# Patient Record
Sex: Female | Born: 1980
Health system: Southern US, Community
[De-identification: ages and names within clinical notes are randomized; demographics above are authoritative.]

## PROBLEM LIST (undated history)

## (undated) DIAGNOSIS — N920 Excessive and frequent menstruation with regular cycle: Secondary | ICD-10-CM

## (undated) DIAGNOSIS — Z973 Presence of spectacles and contact lenses: Secondary | ICD-10-CM

## (undated) DIAGNOSIS — Z923 Personal history of irradiation: Secondary | ICD-10-CM

## (undated) DIAGNOSIS — D649 Anemia, unspecified: Secondary | ICD-10-CM

## (undated) DIAGNOSIS — I2699 Other pulmonary embolism without acute cor pulmonale: Secondary | ICD-10-CM

## (undated) DIAGNOSIS — Z8742 Personal history of other diseases of the female genital tract: Secondary | ICD-10-CM

## (undated) DIAGNOSIS — D509 Iron deficiency anemia, unspecified: Secondary | ICD-10-CM

## (undated) HISTORY — DX: Other pulmonary embolism without acute cor pulmonale: I26.99

## (undated) HISTORY — PX: COLPOSCOPY: SHX161

---

## 2005-01-29 ENCOUNTER — Emergency Department (HOSPITAL_COMMUNITY): Admission: EM | Admit: 2005-01-29 | Discharge: 2005-01-29 | Payer: Self-pay | Admitting: Emergency Medicine

## 2007-11-08 ENCOUNTER — Inpatient Hospital Stay (HOSPITAL_COMMUNITY): Admission: AD | Admit: 2007-11-08 | Discharge: 2007-11-08 | Payer: Self-pay | Admitting: *Deleted

## 2007-11-09 ENCOUNTER — Inpatient Hospital Stay (HOSPITAL_COMMUNITY): Admission: AD | Admit: 2007-11-09 | Discharge: 2007-11-10 | Payer: Self-pay | Admitting: Obstetrics and Gynecology

## 2007-11-10 ENCOUNTER — Inpatient Hospital Stay (HOSPITAL_COMMUNITY): Admission: AD | Admit: 2007-11-10 | Discharge: 2007-11-10 | Payer: Self-pay | Admitting: Obstetrics and Gynecology

## 2007-11-13 ENCOUNTER — Encounter (INDEPENDENT_AMBULATORY_CARE_PROVIDER_SITE_OTHER): Payer: Self-pay | Admitting: *Deleted

## 2007-11-13 ENCOUNTER — Inpatient Hospital Stay (HOSPITAL_COMMUNITY): Admission: AD | Admit: 2007-11-13 | Discharge: 2007-11-16 | Payer: Self-pay | Admitting: Obstetrics and Gynecology

## 2009-03-22 ENCOUNTER — Other Ambulatory Visit: Admission: RE | Admit: 2009-03-22 | Discharge: 2009-03-22 | Payer: Self-pay | Admitting: *Deleted

## 2010-11-07 NOTE — Op Note (Signed)
NAMEJOCELYNNE, Tanya Rangel               ACCOUNT NO.:  [id number]   MEDICAL RECORD NO.:  [id number]          PATIENT TYPE:  INP   LOCATION:  9107                          FACILITY:  [facility]   PHYSICIAN:  Gerri Spore B. Earlene Plater, M.D.  DATE OF BIRTH:  Dec 24, 1980   DATE OF PROCEDURE:  11/13/2007  DATE OF DISCHARGE:                               OPERATIVE REPORT   PREOPERATIVE DIAGNOSIS:  1. A 38-week intrauterine pregnancy.  2. Chorioamnionitis.  3. Fetal tachycardia.   POSTOPERATIVE DIAGNOSIS:  1. A 38-week intrauterine pregnancy.  2. Chorioamnionitis.  3. Fetal tachycardia.   PROCEDURE:  Primary low-transverse C-section.   SURGEON:  Chester Holstein. Earlene Plater, MD.   ASSISTANT:  None.   ANESTHESIA:  Epidural.   FINDINGS:  Viable female 6 pounds 2 ounces, Apgars 9 and 10, pH 7.31,  normal-appearing uterus, tubes, and ovaries.  Placenta was specimen to  pathology.   BLOOD LOSS:  600.   COMPLICATIONS:  None.   INDICATIONS:  The patient presents with spontaneous labor with  spontaneous rupture of membranes, slow progress with maternal axillary  temperature of 103 despite Tylenol and two doses of Unasyn.  Cervix was  only 4 cm dilated in its first pregnancy.  Consideration was made from  delivery was such high fever and associated fetal tachycardia  recommended cesarean section.  Risks of surgery was discussed including  infection, bleeding, and damage to surrounding organs.   PROCEDURE:  The patient was taken to the operating room and epidural  anesthesia deemed adequate.  A Pfannenstiel incision was made.  Fascia  was divided by sharply.  Underlying rectus muscle was dissected off  sharply.  Posterior sheath and peritoneum were entered sharply.  Bladder  blade was inserted.  Bladder flap was created sharply.  Uterine incision  made in low transverse fashion with a knife.  Clear fluid amniotomy  extended laterally bluntly.  Vertex elevated to the incision assistance  with the Mushroom cup vacuum on  the flexion point one pull and no pop  offs from the green zone to deliver the vertex.  Nose and mouth  suctioned with the bulb.  Remainder  of the infant delivered without  great difficulty.  Cord clamped and cut.  The infant was handed off to  awaiting pediatricians.  The patient had just received 1 gram of Unasyn  prior to incision.   Placenta was removed by uterine massage.  Uterus was cleared of all  clots and debris.  Incision was free of extension.  Closed in two-layer  with 0 chromic, first in a running locked and then imbricating fashion.  Hemostasis was obtained.   Pelvis was irrigated.  Bladder flap, uterine incision, and subfascial  space were all hemostatic.  Fascia was closed in a running stitch of 0  Vicryl.  Subcutaneous tissue was irrigated and hemostatic with Bovie.  Skin was closed with staples.   The patient tolerated the procedure well with no complications.  She was  taken to the recovery room in awake, alert, and stable condition.  All  counts correct per the operating staff.  Gerri Spore B. Earlene Plater, M.D.  Electronically Signed     WBD/MEDQ  D:  11/13/2007  T:  11/14/2007  Job:  [id number]

## 2010-11-07 NOTE — H&P (Signed)
NAMESACHIKO, Tanya Rangel               ACCOUNT NO.:  [id number]   MEDICAL RECORD NO.:  [id number]          PATIENT TYPE:  INP   LOCATION:  9107                          FACILITY:  [facility]   PHYSICIAN:  Lenoard Aden, M.D.DATE OF BIRTH:  04/25/1981   DATE OF ADMISSION:  11/13/2007  DATE OF DISCHARGE:                              HISTORY & PHYSICAL   CHIEF COMPLAINT:  Labor.   She is a 30 year old African American female who presents at 75 weeks'  gestation with irregular mild contractions and a fever of questionable  etiology upon presentation.  Fetal tachycardia was noted.  White blood  cell count of 12,000 with a left shift was noted.  Fetal tachycardia  with good beat-to-beat variability was noted.  Due to questionable  leakage of fluid, an ultrasound was performed revealing a biophysical  profile of 8/8, normal AFI, vertex presenting.  However, upon return  from ultrasound, the patient had spontaneous rupture of membranes at  approximately 2:00 a.m. at which point she was admitted, started on  Pitocin, given an epidural, and started empirically on IV antibiotics.   ALLERGIES:  She has no known drug allergies.   MEDICATIONS:  Prenatal vitamins.   Her prenatal course has been complicated by multiple visits for  prodromal labor without cervical change noted.  She has a social history  which is noncontributory and a family history which is noncontributory.  This is her first pregnancy.   PHYSICAL EXAMINATION:  GENERAL:  She is a well-developed, well-nourished  [place] female in no acute distress.  HEENT:  Normal.  LUNGS:  Clear.  HEART:  Regular rate and rhythm.  ABDOMEN:  Soft, gravid, and nontender at this time due to epidural.  No  CVA tenderness.  EXTREMITIES:  There are no cords.  NEUROLOGIC:  Nonfocal.  SKIN:  Intact.  PELVIC:  Cervix is 2 cm, 98, vertex, -1.  Fetal heart rate tracing in  the 150 to 170 beats per minute, change with accelerations.  No  recurrent  decelerations.  Good beat-to-beat variability.  Irregular  contractions.   IMPRESSION:  1. Term intrauterine pregnancy in early labor.  2. Fever of questionable etiology.  We will treat empirically.   PLAN:  Unasyn 2 g IV q. 6 h..  IUPC, Pitocin, epidural, anticipate  attempts at vaginal delivery.      Lenoard Aden, M.D.  Electronically Signed     RJT/MEDQ  D:  11/13/2007  T:  11/13/2007  Job:  [id number]

## 2010-11-10 NOTE — Discharge Summary (Signed)
Tanya Rangel, Tanya Rangel               ACCOUNT NO.:  [id number]   MEDICAL RECORD NO.:  [id number]          PATIENT TYPE:  INP   LOCATION:  9107                          FACILITY:  [facility]   PHYSICIAN:  Gerri Spore B. Earlene Plater, M.D.  DATE OF BIRTH:  [birth year]   DATE OF ADMISSION:  11/13/2007  DATE OF DISCHARGE:  11/16/2007                               DISCHARGE SUMMARY   ADMISSION DIAGNOSIS:  1. A 38-week intrauterine pregnancy.  2. Chorioamnionitis.  3. Fetal tachycardia.   DISCHARGE DIAGNOSES:  1. A 38-week intrauterine pregnancy.  2. Chorioamnionitis.  3. Fetal tachycardia.   PROCEDURE:  Primary low transverse C-section.   HISTORY OF PRESENT ILLNESS:  For details, please see the H&P on the  chart.  Briefly, the patient presented spontaneous labor and progressed  to approximately 4-cm dilated when she was noted to have fever of 102.8  axillary with fetal heart rate in the 160-180s with intermittent  variable decelerations and decreased variability.  She was placed on  Unasyn earlier in the day 4, although her temperature elevations given  the severity of her fever and remoteness from delivery and associated  fetal heart rate changes.  I recommended we proceed with C-section.  As  it seemed, the patient was quite sometime to go prior to being able to  deliver vaginally and did not feel they would tolerate a labor.   The patient was subsequently delivered by primary C-section, viable  female, Apgars 9 and 10, pH 7.31, weight 6 pounds 2 ounces, normal-  appearing uterus, tubes, and ovaries.   Postoperatively, the patient was placed on ampicillin, gentamicin, and  clindamycin for approximately 24 hours postop after which time, she was  afebrile.  By the third postoperative day, the patient was ambulating,  voiding, and tolerating regular diet.  She was discharged home in  satisfactory condition.   DISCHARGE INSTRUCTIONS:  Per booklet.   DISCHARGE MEDICATIONS:  Tylox 1-2 p.o. q.4 h. p.r.n.  pain.   DISPOSITION AT DISCHARGE:  Satisfactory.   FOLLOWUP:  6 weeks.      Gerri Spore B. Earlene Plater, M.D.  Electronically Signed     WBD/MEDQ  D:  12/19/2007  T:  12/20/2007  Job:  [id number]

## 2011-03-21 LAB — DIFFERENTIAL
Basophils Absolute: 0
Lymphocytes Relative: 7 — ABNORMAL LOW
Lymphs Abs: 0.9
Monocytes Absolute: 0.8
Neutro Abs: 10.3 — ABNORMAL HIGH

## 2011-03-21 LAB — CBC
HCT: 29.8 — ABNORMAL LOW
Hemoglobin: 10.2 — ABNORMAL LOW
Hemoglobin: 11.9 — ABNORMAL LOW
Platelets: 267
RBC: 3.53 — ABNORMAL LOW
RDW: 13.8
WBC: 12 — ABNORMAL HIGH

## 2011-03-21 LAB — RPR: RPR Ser Ql: NONREACTIVE

## 2011-03-21 LAB — CREATININE, SERUM
Creatinine, Ser: 0.73
GFR calc non Af Amer: >60

## 2012-04-03 ENCOUNTER — Other Ambulatory Visit: Payer: Self-pay | Admitting: Family Medicine

## 2012-04-03 ENCOUNTER — Other Ambulatory Visit (HOSPITAL_COMMUNITY)
Admission: RE | Admit: 2012-04-03 | Discharge: 2012-04-03 | Disposition: A | Discharge status: Home / Self Care | Payer: 59 | Source: Ambulatory Visit | Attending: Family Medicine | Admitting: Family Medicine

## 2012-04-03 DIAGNOSIS — Z0000 Encounter for general adult medical examination without abnormal findings: Secondary | ICD-10-CM | POA: Insufficient documentation

## 2012-04-11 ENCOUNTER — Other Ambulatory Visit (HOSPITAL_COMMUNITY): Payer: Self-pay | Admitting: Obstetrics and Gynecology

## 2012-04-11 DIAGNOSIS — N979 Female infertility, unspecified: Secondary | ICD-10-CM

## 2012-04-18 ENCOUNTER — Ambulatory Visit (HOSPITAL_COMMUNITY)
Admission: RE | Admit: 2012-04-18 | Discharge: 2012-04-18 | Disposition: A | Discharge status: Home / Self Care | Payer: 59 | Source: Ambulatory Visit | Attending: Obstetrics and Gynecology | Admitting: Obstetrics and Gynecology

## 2012-04-18 ENCOUNTER — Encounter (HOSPITAL_COMMUNITY): Payer: Self-pay

## 2012-04-18 DIAGNOSIS — N979 Female infertility, unspecified: Secondary | ICD-10-CM | POA: Insufficient documentation

## 2012-04-18 MED ORDER — IOHEXOL 300 MG/ML  SOLN
10.0000 mL | Freq: Once | INTRAMUSCULAR | Status: AC | PRN
  Administered 2012-04-18: 10 mL

## 2013-04-22 ENCOUNTER — Emergency Department (INDEPENDENT_AMBULATORY_CARE_PROVIDER_SITE_OTHER): Payer: 59

## 2013-04-22 ENCOUNTER — Emergency Department (HOSPITAL_COMMUNITY)
Admission: EM | Admit: 2013-04-22 | Discharge: 2013-04-22 | Disposition: A | Discharge status: Home / Self Care | Payer: 59 | Source: Home / Self Care | Attending: Family Medicine | Admitting: Family Medicine

## 2013-04-22 ENCOUNTER — Encounter (HOSPITAL_COMMUNITY): Payer: Self-pay | Admitting: Emergency Medicine

## 2013-04-22 DIAGNOSIS — R0789 Other chest pain: Secondary | ICD-10-CM

## 2013-04-22 DIAGNOSIS — R071 Chest pain on breathing: Secondary | ICD-10-CM

## 2013-04-22 LAB — POCT URINALYSIS DIP (DEVICE)
Bilirubin Urine: NEGATIVE
Ketones, ur: NEGATIVE mg/dL
Leukocytes, UA: NEGATIVE

## 2013-04-22 LAB — POCT PREGNANCY, URINE: Preg Test, Ur: NEGATIVE

## 2013-04-22 MED ORDER — DICLOFENAC POTASSIUM 50 MG PO TABS: 50.0000 mg | ORAL_TABLET | Freq: Three times a day (TID) | ORAL | Status: DC

## 2013-04-22 NOTE — ED Notes (Signed)
C/o right sided abd pain radiating to the shoulder for a week now.   Patient states she feels it is her gallbladder.

## 2013-04-22 NOTE — ED Provider Notes (Signed)
CSN: [id number]     Arrival date & time 04/22/13  1759 History   First MD Initiated Contact with Patient 04/22/13 1819     Chief Complaint  Patient presents with  . Abdominal Pain  . Shoulder Pain   (Consider location/radiation/quality/duration/timing/severity/associated sxs/prior Treatment) Patient is a 32 y.o. female presenting with abdominal pain. The history is provided by the patient.  Abdominal Pain This is a new problem. The current episode started more than 1 week ago. The problem occurs constantly. The problem has been gradually worsening. Associated symptoms include chest pain. Pertinent negatives include no abdominal pain and no shortness of breath.    History reviewed. No pertinent past medical history. History reviewed. No pertinent past surgical history. History reviewed. No pertinent family history. History  Substance Use Topics  . Smoking status: Not on file  . Smokeless tobacco: Not on file  . Alcohol Use: Not on file   OB History   Grav Para Term Preterm Abortions TAB SAB Ect Mult Living                 Review of Systems  Constitutional: Negative for fever and appetite change.  Respiratory: Negative for cough, chest tightness and shortness of breath.   Cardiovascular: Positive for chest pain. Negative for palpitations and leg swelling.  Gastrointestinal: Negative for nausea, vomiting, abdominal pain and diarrhea.    Allergies  Review of patient's allergies indicates no known allergies.  Home Medications   Current Outpatient Rx  Name  Route  Sig  Dispense  Refill  . diclofenac (CATAFLAM) 50 MG tablet   Oral   Take 1 tablet (50 mg total) by mouth 3 (three) times daily.   30 tablet   0    BP 114/76  Pulse 79  Temp(Src) 98.9 F (37.2 C) (Oral)  Resp 18  SpO2 100%  LMP 04/17/2013 Physical Exam  Nursing note and vitals reviewed. Constitutional: She is oriented to person, place, and time. She appears well-developed and well-nourished. No distress.   HENT:  Head: Normocephalic.  Mouth/Throat: Oropharynx is clear and moist.  Neck: Normal range of motion. Neck supple. No thyromegaly present.  Cardiovascular: Normal rate, regular rhythm, normal heart sounds and intact distal pulses.   Pulmonary/Chest: Effort normal and breath sounds normal. No respiratory distress. She has no wheezes. She exhibits tenderness.  Abdominal: Soft. Bowel sounds are normal. She exhibits no distension and no mass. There is no tenderness. There is no rebound and no guarding.  Lymphadenopathy:    She has no cervical adenopathy.  Neurological: She is alert and oriented to person, place, and time.  Skin: Skin is warm and dry.    ED Course  Procedures (including critical care time) Labs Review Labs Reviewed  POCT URINALYSIS DIP (DEVICE)  POCT PREGNANCY, URINE   Imaging Review Dg Chest 2 View  04/22/2013   CLINICAL DATA:  Right-sided chest pain  EXAM: CHEST  2 VIEW  COMPARISON:  None.  FINDINGS: The heart size and mediastinal contours are within normal limits. Both lungs are clear. The trachea is midline. The visualized skeletal structures are unremarkable.  IMPRESSION: No acute cardiopulmonary disease.   Electronically Signed   By: Britta Mccreedy M.D.   On: 04/22/2013 20:03      MDM  X-rays reviewed and report per radiologist.     Linna Hoff, MD 04/22/13 2020

## 2013-04-29 ENCOUNTER — Other Ambulatory Visit (HOSPITAL_COMMUNITY)
Admission: RE | Admit: 2013-04-29 | Discharge: 2013-04-29 | Disposition: A | Discharge status: Home / Self Care | Payer: 59 | Source: Ambulatory Visit | Attending: Family Medicine | Admitting: Family Medicine

## 2013-04-29 ENCOUNTER — Other Ambulatory Visit: Payer: Self-pay | Admitting: Family Medicine

## 2013-04-29 DIAGNOSIS — Z0000 Encounter for general adult medical examination without abnormal findings: Secondary | ICD-10-CM | POA: Insufficient documentation

## 2013-08-05 IMAGING — RF DG HYSTEROGRAM
8 series · 8 of 8 positions shown · IV contrast (omnipaque)
Comparison: none

CLINICAL DATA: Infertility.  The patient had one child by cesarean
section in 5114 with

HYSTEROSALPINGOGRAM
TECHNIQUE: Following cleansing of the cervix and vagina with
Betadine solution, a hysterosalpingogram was performed using a 5-
French hysterosalpingogram catheter and Omnipaque 300 contrast.
The patient tolerated the examination without difficulty.
Fluoroscopy time: 2.0 minutes.

[Series 1: run · 1 of 1 slices shown (1 of 8)]
[im 1/1]
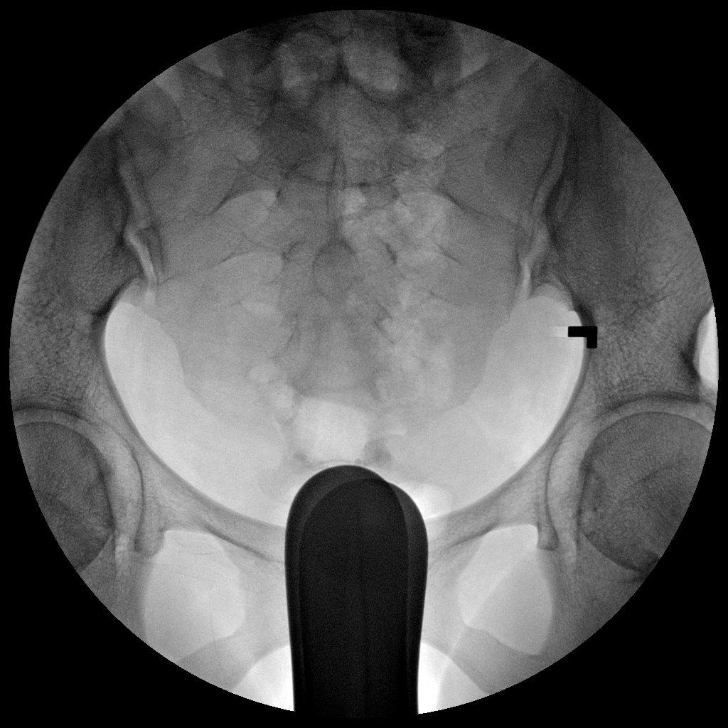

[Series 2: run · 1 of 1 slices shown (2 of 8)]
[im 1/1]
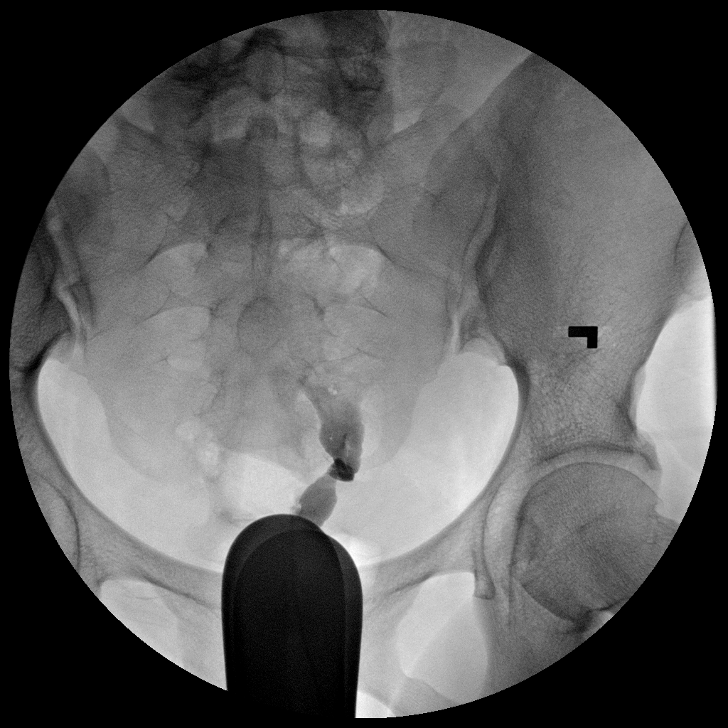

[Series 3: run · 1 of 1 slices shown (3 of 8)]
[im 1/1]
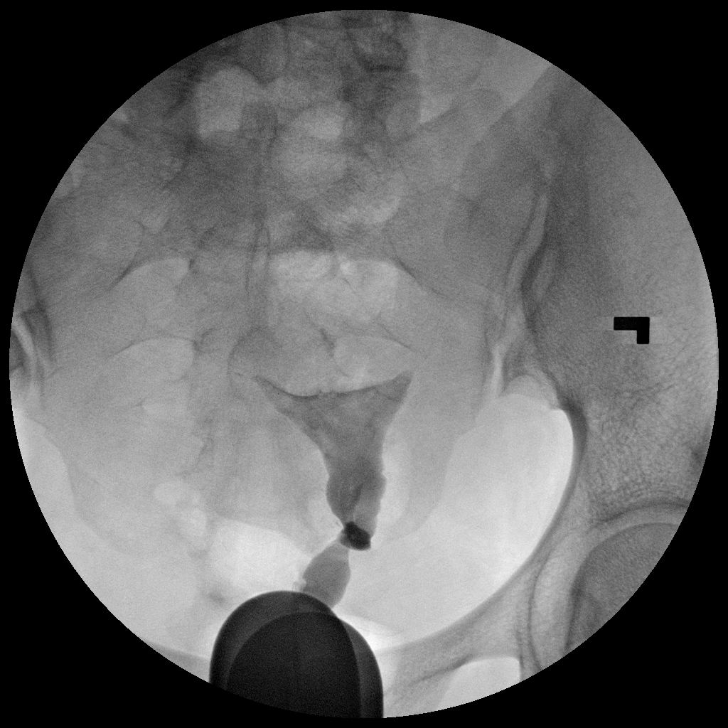

[Series 4: run · 1 of 1 slices shown (4 of 8)]
[im 1/1]
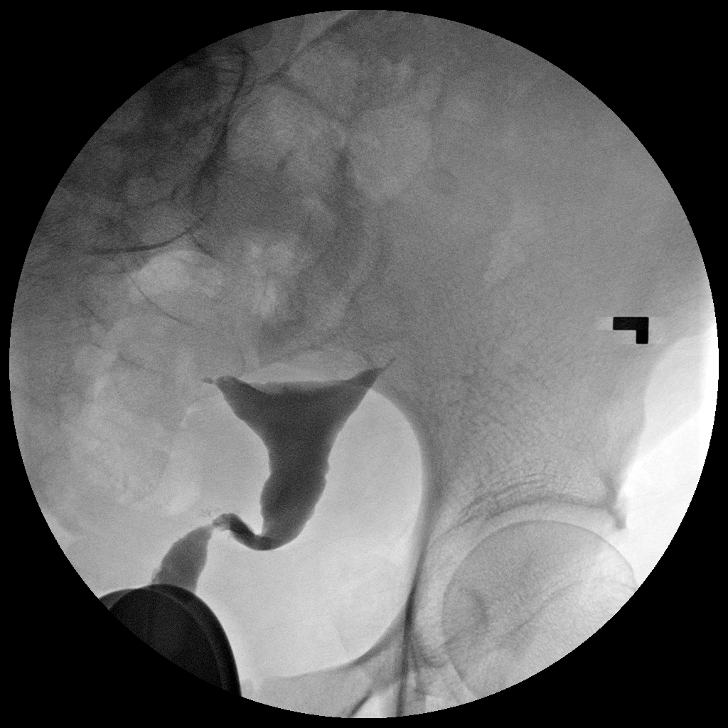

[Series 5: run · 1 of 1 slices shown (5 of 8)]
[im 1/1]
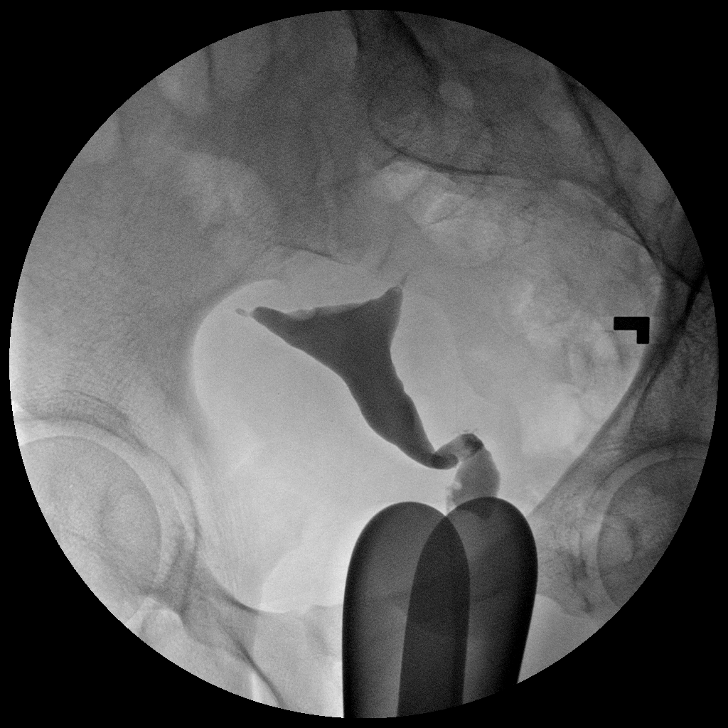

[Series 6: run · 1 of 1 slices shown (6 of 8)]
[im 1/1]
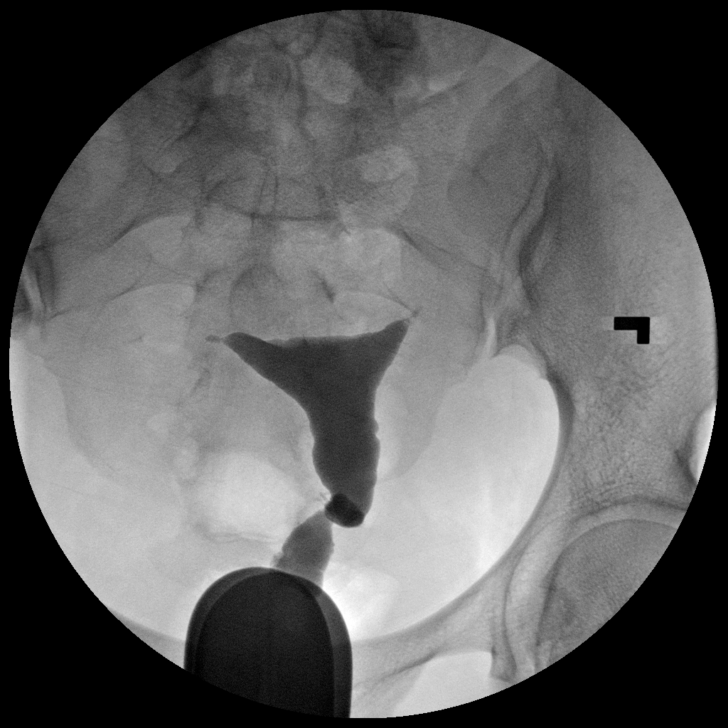

[Series 7: run · 1 of 1 slices shown (7 of 8)]
[im 1/1]
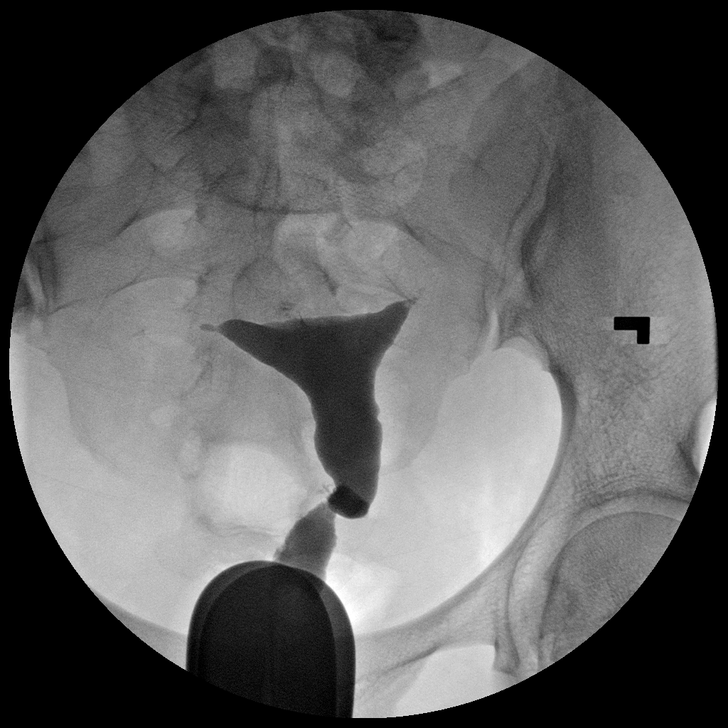

[Series 8: run · 1 of 1 slices shown (8 of 8)]
[im 1/1]
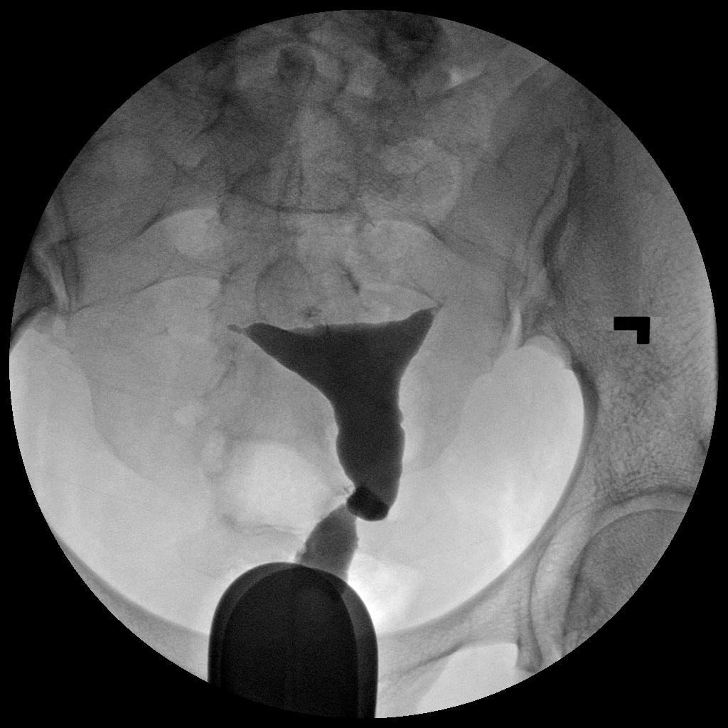

[8 of 8 positions shown; findings below may reference images not displayed]

FINDINGS: The endometrial cavity is normal in contour and
demonstrates no internal filling defects.  The uterine cornua are
well distended with contrast.  The interstitial portions of both
fallopian tubes opacify.  Beyond this, there is no opacification of
either fallopian tube, despite further administration of contrast,
to the level of patient tolerance.  There is no free
intraperitoneal spill of contrast.
IMPRESSION: 1.  Neither fallopian tube opacifies beyond the interstitial
portion. These findings suggest bilateral proximal tubal
occlusions.  Tubal spasm can also be a cause of nonopacification.
2.  Normal endometrial cavity.

## 2014-06-07 ENCOUNTER — Other Ambulatory Visit: Payer: Self-pay | Admitting: Occupational Medicine

## 2014-06-07 ENCOUNTER — Ambulatory Visit: Payer: Self-pay

## 2014-06-07 DIAGNOSIS — R7612 Nonspecific reaction to cell mediated immunity measurement of gamma interferon antigen response without active tuberculosis: Secondary | ICD-10-CM

## 2015-08-02 ENCOUNTER — Other Ambulatory Visit: Payer: Self-pay | Admitting: Family Medicine

## 2015-08-02 ENCOUNTER — Other Ambulatory Visit (HOSPITAL_COMMUNITY)
Admission: RE | Admit: 2015-08-02 | Discharge: 2015-08-02 | Disposition: A | Discharge status: Home / Self Care | Payer: 59 | Source: Ambulatory Visit | Attending: Family Medicine | Admitting: Family Medicine

## 2015-08-02 DIAGNOSIS — N898 Other specified noninflammatory disorders of vagina: Secondary | ICD-10-CM | POA: Diagnosis not present

## 2015-08-02 DIAGNOSIS — Z124 Encounter for screening for malignant neoplasm of cervix: Secondary | ICD-10-CM | POA: Insufficient documentation

## 2015-08-03 LAB — CYTOLOGY - PAP

## 2016-01-10 MED FILL — AZITHROMYCIN 500 MG TABLET: 500 | 3 days supply | Qty: 3 | Fill #0

## 2016-01-10 MED FILL — MEFLOQUINE HCL 250 MG TAB: 250 | 63 days supply | Qty: 9 | Fill #0

## 2016-06-15 DIAGNOSIS — D649 Anemia, unspecified: Secondary | ICD-10-CM | POA: Diagnosis not present

## 2016-06-15 DIAGNOSIS — Z131 Encounter for screening for diabetes mellitus: Secondary | ICD-10-CM | POA: Diagnosis not present

## 2016-06-15 DIAGNOSIS — Z0000 Encounter for general adult medical examination without abnormal findings: Secondary | ICD-10-CM | POA: Diagnosis not present

## 2018-09-24 DIAGNOSIS — Z86711 Personal history of pulmonary embolism: Secondary | ICD-10-CM

## 2018-09-24 HISTORY — DX: Personal history of pulmonary embolism: Z86.711

## 2018-10-10 ENCOUNTER — Other Ambulatory Visit: Payer: Self-pay | Admitting: Nurse Practitioner

## 2018-10-10 ENCOUNTER — Other Ambulatory Visit: Payer: Self-pay

## 2018-10-10 ENCOUNTER — Emergency Department (HOSPITAL_COMMUNITY): Payer: BC Managed Care – PPO

## 2018-10-10 ENCOUNTER — Emergency Department (HOSPITAL_BASED_OUTPATIENT_CLINIC_OR_DEPARTMENT_OTHER): Payer: BC Managed Care – PPO

## 2018-10-10 ENCOUNTER — Emergency Department (HOSPITAL_COMMUNITY)
Admission: EM | Admit: 2018-10-10 | Discharge: 2018-10-10 | Disposition: A | Discharge status: Home / Self Care | Payer: BC Managed Care – PPO | Attending: Emergency Medicine | Admitting: Emergency Medicine

## 2018-10-10 ENCOUNTER — Encounter (HOSPITAL_COMMUNITY): Payer: Self-pay | Admitting: Emergency Medicine

## 2018-10-10 DIAGNOSIS — I2694 Multiple subsegmental pulmonary emboli without acute cor pulmonale: Secondary | ICD-10-CM | POA: Insufficient documentation

## 2018-10-10 DIAGNOSIS — M79609 Pain in unspecified limb: Secondary | ICD-10-CM | POA: Diagnosis not present

## 2018-10-10 DIAGNOSIS — R0602 Shortness of breath: Secondary | ICD-10-CM | POA: Diagnosis not present

## 2018-10-10 DIAGNOSIS — I8002 Phlebitis and thrombophlebitis of superficial vessels of left lower extremity: Secondary | ICD-10-CM

## 2018-10-10 DIAGNOSIS — I808 Phlebitis and thrombophlebitis of other sites: Secondary | ICD-10-CM | POA: Diagnosis not present

## 2018-10-10 DIAGNOSIS — M79605 Pain in left leg: Secondary | ICD-10-CM | POA: Diagnosis present

## 2018-10-10 DIAGNOSIS — R7989 Other specified abnormal findings of blood chemistry: Secondary | ICD-10-CM

## 2018-10-10 HISTORY — DX: Anemia, unspecified: D64.9

## 2018-10-10 LAB — BASIC METABOLIC PANEL
Anion gap: 12 (ref 5–15)
BUN: 8 mg/dL (ref 6–20)
CO2: 20 mmol/L — ABNORMAL LOW (ref 22–32)
Calcium: 9.6 mg/dL (ref 8.9–10.3)
Chloride: 107 mmol/L (ref 98–111)
Creatinine, Ser: 1.08 mg/dL — ABNORMAL HIGH (ref 0.44–1.00)
GFR calc Af Amer: >60 mL/min (ref >60)
GFR calc non Af Amer: >60 mL/min (ref >60)
Glucose, Bld: 101 mg/dL — ABNORMAL HIGH (ref 70–99)
Potassium: 4.2 mmol/L (ref 3.5–5.1)
Sodium: 139 mmol/L (ref 135–145)

## 2018-10-10 LAB — CBC WITH DIFFERENTIAL/PLATELET
Abs Immature Granulocytes: 0 10*3/uL (ref 0.00–0.07)
Basophils Absolute: 0 10*3/uL (ref 0.0–0.1)
Basophils Relative: 1 %
Eosinophils Absolute: 0.5 10*3/uL (ref 0.0–0.5)
Eosinophils Relative: 10 %
HCT: 37.4 % (ref 36.0–46.0)
Hemoglobin: 11.7 g/dL — ABNORMAL LOW (ref 12.0–15.0)
Immature Granulocytes: 0 %
Lymphocytes Relative: 38 %
Lymphs Abs: 1.9 10*3/uL (ref 0.7–4.0)
MCH: 26.8 pg (ref 26.0–34.0)
MCHC: 31.3 g/dL (ref 30.0–36.0)
MCV: 85.8 fL (ref 80.0–100.0)
Monocytes Absolute: 0.5 10*3/uL (ref 0.1–1.0)
Monocytes Relative: 10 %
Neutro Abs: 2.1 10*3/uL (ref 1.7–7.7)
Neutrophils Relative %: 41 %
Platelets: 224 10*3/uL (ref 150–400)
RBC: 4.36 MIL/uL (ref 3.87–5.11)
RDW: 13.1 % (ref 11.5–15.5)
WBC: 5 10*3/uL (ref 4.0–10.5)
nRBC: 0 % (ref 0.0–0.2)

## 2018-10-10 LAB — TROPONIN I: Troponin I: <0.03 ng/mL (ref <0.03)

## 2018-10-10 MED ORDER — RIVAROXABAN (XARELTO) EDUCATION KIT FOR DVT/PE PATIENTS
PACK | Freq: Once | Status: AC
  Administered 2018-10-10: 20:00:00

## 2018-10-10 MED ORDER — RIVAROXABAN (XARELTO) VTE STARTER PACK (15 & 20 MG): ORAL_TABLET | ORAL | 0 refills | Status: DC

## 2018-10-10 MED ORDER — IOHEXOL 350 MG/ML SOLN
100.0000 mL | Freq: Once | INTRAVENOUS | Status: AC | PRN
  Administered 2018-10-10: 18:00:00 100 mL via INTRAVENOUS

## 2018-10-10 MED ORDER — RIVAROXABAN 15 MG PO TABS
15.0000 mg | ORAL_TABLET | Freq: Once | ORAL | Status: AC
  Administered 2018-10-10: 15 mg via ORAL
  Filled 2018-10-10: qty 1

## 2018-10-10 NOTE — ED Notes (Signed)
Patient verbalizes understanding of discharge instructions. Opportunity for questioning and answers were provided. Armband removed by staff, pt discharged from ED.  

## 2018-10-10 NOTE — Progress Notes (Addendum)
Left lower extremity venous duplex completed. Refer to "CV Proc" under chart review to view preliminary results.  Preliminary results discussed with Dr. Tyrone Nine.  10/10/2018 5:17 PM Maudry Mayhew, MHA, RVT, RDCS, RDMS

## 2018-10-10 NOTE — Discharge Instructions (Signed)
Return to the ED for worsening shortness of breath, chest pain or if you pass out or feel like you will pass out.

## 2018-10-10 NOTE — ED Provider Notes (Signed)
[facility] EMERGENCY DEPARTMENT Provider Note   CSN: [id number] Arrival date & time: 10/10/18  1533    History   Chief Complaint Chief Complaint  Patient presents with   Leg Pain   Shortness of Breath    HPI Tanya Rangel is a 38 y.o. female.     38 yo F with a cc of left leg pain.  Going on for the past week or so.  Pain worse with palpation or ambulation.  Ddimer performed as outpatient and +.  Started having sob about 2 hours ago.  No chest pain, non exertional.  No cough, fever.  No sick contacts.  No recent travel or immobilization.  No history of PE or DVT.  Denies recent surgery.  Denies recent hospitalization.  Denies birth control use.  The history is provided by the patient.  Leg Pain  Associated symptoms: no fever   Shortness of Breath  Associated symptoms: no chest pain, no fever, no headaches, no vomiting and no wheezing   Illness  Severity:  Mild Onset quality:  Gradual Duration:  2 days Timing:  Constant Progression:  Worsening Chronicity:  New Associated symptoms: myalgias and shortness of breath   Associated symptoms: no chest pain, no congestion, no fever, no headaches, no nausea, no rhinorrhea, no vomiting and no wheezing     Past Medical History:  Diagnosis Date   Anemia     There are no active problems to display for this patient.   History reviewed. No pertinent surgical history.   OB History   No obstetric history on file.      Home Medications    Prior to Admission medications   Medication Sig Start Date End Date Taking? Authorizing Provider  diclofenac (CATAFLAM) 50 MG tablet Take 1 tablet (50 mg total) by mouth 3 (three) times daily. 04/22/13   Billy Fischer, MD  Rivaroxaban 15 & 20 MG TBPK Take as directed on package: Start with one 16m tablet by mouth twice a day with food. On Day 22, switch to one 278mtablet once a day with food. 10/10/18   FlDeno EtienneDO    Family History No family history on  file.  Social History Social History   Tobacco Use   Smoking status: Not on file  Substance Use Topics   Alcohol use: Not on file   Drug use: Not on file     Allergies   Patient has no known allergies.   Review of Systems Review of Systems  Constitutional: Negative for chills and fever.  HENT: Negative for congestion and rhinorrhea.   Eyes: Negative for redness and visual disturbance.  Respiratory: Positive for shortness of breath. Negative for wheezing.   Cardiovascular: Negative for chest pain and palpitations.  Gastrointestinal: Negative for nausea and vomiting.  Genitourinary: Negative for dysuria and urgency.  Musculoskeletal: Positive for arthralgias and myalgias.  Skin: Negative for pallor and wound.  Neurological: Negative for dizziness and headaches.     Physical Exam Updated Vital Signs BP (!) 141/86 (BP Location: Right Arm)    Pulse (!) 108    Temp 97.9 F (36.6 C) (Oral)    Resp 18    Ht 5' 5" (1.651 m)    Wt 68.9 kg    LMP 09/24/2018    SpO2 100%    BMI 25.29 kg/m   Physical Exam Vitals signs and nursing note reviewed.  Constitutional:      General: She is not in acute distress.  Appearance: She is well-developed. She is not diaphoretic.  HENT:     Head: Normocephalic and atraumatic.  Eyes:     Pupils: Pupils are equal, round, and reactive to light.  Neck:     Musculoskeletal: Normal range of motion and neck supple.  Cardiovascular:     Rate and Rhythm: Normal rate and regular rhythm.     Heart sounds: No murmur. No friction rub. No gallop.   Pulmonary:     Effort: Pulmonary effort is normal.     Breath sounds: No wheezing or rales.  Abdominal:     General: There is no distension.     Palpations: Abdomen is soft.     Tenderness: There is no abdominal tenderness.  Musculoskeletal:     Right lower leg: She exhibits no tenderness. No edema.     Left lower leg: She exhibits tenderness. No edema.     Comments: Left medial calf pain.     Skin:    General: Skin is warm and dry.  Neurological:     Mental Status: She is alert and oriented to person, place, and time.  Psychiatric:        Behavior: Behavior normal.      ED Treatments / Results  Labs (all labs ordered are listed, but only abnormal results are displayed) Labs Reviewed  CBC WITH DIFFERENTIAL/PLATELET - Abnormal; Notable for the following components:      Result Value   Hemoglobin 11.7 (*)    All other components within normal limits  BASIC METABOLIC PANEL - Abnormal; Notable for the following components:   CO2 20 (*)    Glucose, Bld 101 (*)    Creatinine, Ser 1.08 (*)    All other components within normal limits  TROPONIN I    EKG None  Radiology Ct Angio Chest Pe W And/or Wo Contrast  Result Date: 10/10/2018 CLINICAL DATA:  Left calf pain.  Shortness of breath for 1 hour EXAM: CT ANGIOGRAPHY CHEST WITH CONTRAST TECHNIQUE: Multidetector CT imaging of the chest was performed using the standard protocol during bolus administration of intravenous contrast. Multiplanar CT image reconstructions and MIPs were obtained to evaluate the vascular anatomy. CONTRAST:  155m OMNIPAQUE IOHEXOL 350 MG/ML SOLN COMPARISON:  None. FINDINGS: Cardiovascular: Satisfactory opacification of the pulmonary arteries to the segmental level. Right lower lobe subsegmental pulmonary emboli. Normal heart size. No pericardial effusion. Mediastinum/Nodes: No enlarged mediastinal, hilar, or axillary lymph nodes. Thyroid gland, trachea, and esophagus demonstrate no significant findings. Lungs/Pleura: Lungs are clear. No pleural effusion or pneumothorax. Upper Abdomen: No acute abnormality. Musculoskeletal: No chest wall abnormality. No acute or significant osseous findings. Review of the MIP images confirms the above findings. IMPRESSION: 1. Right lower lobe subsegmental pulmonary emboli.  No heart strain. Critical Value/emergent results were called by telephone at the time of interpretation  on 10/10/2018 at 6:56 pm to Dr. DDeno Etienne, who verbally acknowledged these results. Electronically Signed   By: HKathreen Devoid  On: 10/10/2018 18:57   Vas UKoreaLower Extremity Venous (dvt) (only [facility])  Result Date: 10/10/2018  Lower Venous Study Indications: Pain, and SOB.  Performing Technologist: MMaudry MayhewMHA, RDMS, RVT, RDCS  Examination Guidelines: A complete evaluation includes B-mode imaging, spectral Doppler, color Doppler, and power Doppler as needed of all accessible portions of each vessel. Bilateral testing is considered an integral part of a complete examination. Limited examinations for reoccurring indications may be performed as noted.  +-----+---------------+---------+-----------+----------+--------------+  RIGHT Compressibility Phasicity Spontaneity Properties  Summary         +-----+---------------+---------+-----------+----------+--------------+  CFV                                                    Not visualized  +-----+---------------+---------+-----------+----------+--------------+   +---------+---------------+---------+-----------+----------+-------+  LEFT      Compressibility Phasicity Spontaneity Properties Summary  +---------+---------------+---------+-----------+----------+-------+  CFV       Full            Yes       Yes                             +---------+---------------+---------+-----------+----------+-------+  SFJ       Full                                                      +---------+---------------+---------+-----------+----------+-------+  FV Prox   Full                                                      +---------+---------------+---------+-----------+----------+-------+  FV Mid    Full                                                      +---------+---------------+---------+-----------+----------+-------+  FV Distal Full                                                      +---------+---------------+---------+-----------+----------+-------+  PFV       Full                                                       +---------+---------------+---------+-----------+----------+-------+  POP       Full            Yes       Yes                             +---------+---------------+---------+-----------+----------+-------+  PTV       Full                                                      +---------+---------------+---------+-----------+----------+-------+  PERO      Full                                                      +---------+---------------+---------+-----------+----------+-------+  Summary: Left: Findings consistent with acute superficial vein thrombosis involving the left varicosities. There is no evidence of deep vein thrombosis in the lower extremity. No cystic structure found in the popliteal fossa.  *See table(s) above for measurements and observations.    Preliminary     Procedures Procedures (including critical care time)  Medications Ordered in ED Medications  rivaroxaban (XARELTO) Education Kit for DVT/PE patients (has no administration in time range)  Rivaroxaban (XARELTO) tablet 15 mg (has no administration in time range)  iohexol (OMNIPAQUE) 350 MG/ML injection 100 mL (100 mLs Intravenous Contrast Given 10/10/18 1819)     Initial Impression / Assessment and Plan / ED Course  I have reviewed the triage vital signs and the nursing notes.  Pertinent labs & imaging results that were available during my care of the patient were reviewed by me and considered in my medical decision making (see chart for details).        38 yo F with a chief complaint of shortness of breath and left lower extremity pain.  Patient had a d-dimer performed for the left lower extremity pain and was sent here for rule out DVT.  Patient however started shortness of breath 2 hours prior to arrival.  She denies any infectious symptoms.  DVT study shows superficial thrombophlebitis.  Patient's CT angiogram of the chest unfortunately does show a couple all  subsegmental PEs.  She is well-appearing and nontoxic.  Has not been hypertensive.  She has had some mild tachycardia.  Her PESI score is 39.  I discussed with her the risks and benefits of hospital admission and at this point she will be discharged home on Xarelto.  Have her call her family doctor tomorrow.  She will return for any worsening symptoms or syncopal event.  7:08 PM:  I have discussed the diagnosis/risks/treatment options with the patient and believe the pt to be eligible for discharge home to follow-up with PCP. We also discussed returning to the ED immediately if new or worsening sx occur. We discussed the sx which are most concerning (e.g., sudden worsening sob, fever, syncope) that necessitate immediate return. Medications administered to the patient during their visit and any new prescriptions provided to the patient are listed below.  Medications given during this visit Medications  rivaroxaban [employer]) Education Kit for DVT/PE patients (has no administration in time range)  Rivaroxaban (XARELTO) tablet 15 mg (has no administration in time range)  iohexol (OMNIPAQUE) 350 MG/ML injection 100 mL (100 mLs Intravenous Contrast Given 10/10/18 1819)     The patient appears reasonably screen and/or stabilized for discharge and I doubt any other medical condition or other [facility] requiring further screening, evaluation, or treatment in the ED at this time prior to discharge.    Final Clinical Impressions(s) / ED Diagnoses   Final diagnoses:  Multiple subsegmental pulmonary emboli without acute cor pulmonale  Thrombophlebitis of superficial veins of left lower extremity    ED Discharge Orders         Ordered    Rivaroxaban 15 & 20 MG TBPK     10/10/18 1904           Deno Etienne, DO 10/10/18 1908

## 2018-10-10 NOTE — ED Triage Notes (Signed)
Pt states she has had left calf pain since yesterday, shortness of breath starting 1 hour ago. Pt had D dimer drawn that was elevated.

## 2018-10-14 ENCOUNTER — Other Ambulatory Visit: Payer: Self-pay

## 2018-10-28 ENCOUNTER — Telehealth: Payer: Self-pay | Admitting: Internal Medicine

## 2018-10-28 NOTE — Telephone Encounter (Signed)
A new hem appt has been scheduled for Tanya Rangel to see Dr. Walden Field on 5/15 at 930am. Pt has been made aware to arrive 15 minutes early.

## 2018-11-07 ENCOUNTER — Telehealth: Payer: Self-pay | Admitting: *Deleted

## 2018-11-07 ENCOUNTER — Inpatient Hospital Stay: Payer: BC Managed Care – PPO

## 2018-11-07 ENCOUNTER — Other Ambulatory Visit: Payer: Self-pay

## 2018-11-07 ENCOUNTER — Other Ambulatory Visit: Payer: Self-pay | Admitting: Internal Medicine

## 2018-11-07 ENCOUNTER — Inpatient Hospital Stay: Payer: BC Managed Care – PPO | Attending: Internal Medicine | Admitting: Internal Medicine

## 2018-11-07 ENCOUNTER — Encounter: Payer: Self-pay | Admitting: Internal Medicine

## 2018-11-07 VITALS — BP 106/62 | HR 62 | Temp 99.8°F | Resp 18 | Ht 65.0 in | Wt 152.7 lb

## 2018-11-07 DIAGNOSIS — Z7901 Long term (current) use of anticoagulants: Secondary | ICD-10-CM | POA: Diagnosis not present

## 2018-11-07 DIAGNOSIS — I2699 Other pulmonary embolism without acute cor pulmonale: Secondary | ICD-10-CM | POA: Insufficient documentation

## 2018-11-07 DIAGNOSIS — N939 Abnormal uterine and vaginal bleeding, unspecified: Secondary | ICD-10-CM

## 2018-11-07 DIAGNOSIS — D509 Iron deficiency anemia, unspecified: Secondary | ICD-10-CM | POA: Insufficient documentation

## 2018-11-07 DIAGNOSIS — N920 Excessive and frequent menstruation with regular cycle: Secondary | ICD-10-CM | POA: Diagnosis not present

## 2018-11-07 LAB — CBC WITH DIFFERENTIAL (CANCER CENTER ONLY)
Abs Immature Granulocytes: 0 10*3/uL (ref 0.00–0.07)
Basophils Absolute: 0 10*3/uL (ref 0.0–0.1)
Basophils Relative: 1 %
Eosinophils Absolute: 0.5 10*3/uL (ref 0.0–0.5)
Eosinophils Relative: 14 %
HCT: 30.9 % — ABNORMAL LOW (ref 36.0–46.0)
Hemoglobin: 9.8 g/dL — ABNORMAL LOW (ref 12.0–15.0)
Immature Granulocytes: 0 %
Lymphocytes Relative: 43 %
Lymphs Abs: 1.4 10*3/uL (ref 0.7–4.0)
MCH: 27.3 pg (ref 26.0–34.0)
MCHC: 31.7 g/dL (ref 30.0–36.0)
MCV: 86.1 fL (ref 80.0–100.0)
Monocytes Absolute: 0.3 10*3/uL (ref 0.1–1.0)
Monocytes Relative: 9 %
Neutro Abs: 1.1 10*3/uL — ABNORMAL LOW (ref 1.7–7.7)
Neutrophils Relative %: 33 %
Platelet Count: 282 10*3/uL (ref 150–400)
RBC: 3.59 MIL/uL — ABNORMAL LOW (ref 3.87–5.11)
RDW: 13.2 % (ref 11.5–15.5)
WBC Count: 3.2 10*3/uL — ABNORMAL LOW (ref 4.0–10.5)
nRBC: 0 % (ref 0.0–0.2)

## 2018-11-07 LAB — CMP (CANCER CENTER ONLY)
ALT: 29 U/L (ref 0–44)
AST: 21 U/L (ref 15–41)
Albumin: 3.9 g/dL (ref 3.5–5.0)
Alkaline Phosphatase: 64 U/L (ref 38–126)
Anion gap: 5 (ref 5–15)
BUN: 7 mg/dL (ref 6–20)
CO2: 26 mmol/L (ref 22–32)
Calcium: 8.8 mg/dL — ABNORMAL LOW (ref 8.9–10.3)
Chloride: 107 mmol/L (ref 98–111)
Creatinine: 0.78 mg/dL (ref 0.44–1.00)
GFR, Est AFR Am: >60 mL/min (ref >60)
GFR, Estimated: >60 mL/min (ref >60)
Glucose, Bld: 92 mg/dL (ref 70–99)
Potassium: 4.3 mmol/L (ref 3.5–5.1)
Sodium: 138 mmol/L (ref 135–145)
Total Bilirubin: 0.4 mg/dL (ref 0.3–1.2)
Total Protein: 7.1 g/dL (ref 6.5–8.1)

## 2018-11-07 LAB — LACTATE DEHYDROGENASE: LDH: 132 U/L (ref 98–192)

## 2018-11-07 LAB — FERRITIN: Ferritin: 7 ng/mL — ABNORMAL LOW (ref 11–307)

## 2018-11-07 LAB — IRON AND TIBC
Iron: 31 ug/dL — ABNORMAL LOW (ref 41–142)
Saturation Ratios: 8 % — ABNORMAL LOW (ref 21–57)
TIBC: 411 ug/dL (ref 236–444)
UIBC: 380 ug/dL (ref 120–384)

## 2018-11-07 MED ORDER — FERROUS SULFATE 325 (65 FE) MG PO TABS: 325.0000 mg | ORAL_TABLET | Freq: Every day | ORAL | 3 refills | Status: DC

## 2018-11-07 NOTE — Telephone Encounter (Signed)
TCT patient regarding lab results from today.  Spoke with patient and reviewed lab results with her. Informed her that her iron levels are low and her HGBhas dropped some as well.  Per Dr. Royanne Foots patient appts for IV iron or, if she preferred, oral iron supplement. Pt prefers oral iron at this time.   Pt uses [facility] [address].

## 2018-11-07 NOTE — Telephone Encounter (Signed)
-----   Message from Zoila Shutter, MD sent at 11/07/2018  1:06 PM EDT ----- Notify pt that Iron levels are low.  She has option of oral or IV iron.

## 2018-11-07 NOTE — Progress Notes (Signed)
Referring Physician:  Dr. Deno Etienne and Dr. Kelton Pillar  Diagnosis Other acute pulmonary embolism without acute cor pulmonale ([place]) - Plan: CBC with Differential ([place] Only), CMP ([place] only), Lactate dehydrogenase (LDH), Ferritin, Iron and TIBC, Factor 5 Leiden*, Prothrombin gene mutation*, Lupus anticoagulant panel*, Beta-2-glycoprotein i abs, IgG/M/A, ANA, IFA (with reflex)  Abnormal uterine and vaginal bleeding, unspecified - Plan: CBC with Differential ([place] Only), CMP ([place] only), Lactate dehydrogenase (LDH), Ferritin, Iron and TIBC, Factor 5 Leiden*, Prothrombin gene mutation*, Lupus anticoagulant panel*, Beta-2-glycoprotein i abs, IgG/M/A, ANA, IFA (with reflex), CT Abdomen Pelvis W Contrast  Staging Cancer Staging No matching staging information was found for the patient.  Assessment and Plan:  2.  PE.  38 year old female referred for evaluation due to PE.  Pt presented to ER on 10/10/2018 with complaint of shortness of breath and left lower extremity pain.  Patient had a d-dimer performed for the left lower extremity pain.  Pt has left LE doppler done on 10/10/2018 that showed  Summary: Left: Findings consistent with acute superficial vein thrombosis involving the left varicosities. There is no evidence of deep vein thrombosis in the lower extremity.  She had CTA of chest done 10/10/2018 that showed  IMPRESSION: 1. Right lower lobe subsegmental pulmonary emboli.  No heart strain.  Pt was started on Xarelto.  She reports improvement in symptoms since starting anticoagulation.  She reports menorrhagia.  She denies history of uterine fibroids.  Pt reports this is her first blood clot.  She has taken Mini pills for OCP in the past and reports she was on hormones for IVF in 2017.  She is not currently on OCPs.  She denies any family history of thrombosis.  Pt is seen today for consultation due to PE.    Labs done today 11/07/2018 reviewed and showed WBC 3.2  HB 9.8 plts 282,000.  Chemistries WNL with K+ 4.3 Cr 0.78 and normal LFTs.  Pt will undergo hypercoagulable evaluation.  I discussed with her risk factors for thrombosis which include recent surgery, extensive travel and hormonal therapy.  Pt has taken hormonal treatments in the past.  She will be set up for CT of abdomen and pelvis due to complaints of menorrhagia and to evaluate for any evidence of occult malignancy. I have discussed with her she is recommended for 6 months of anticoagulation with repeat imaging to assess resolution of thrombosis prior to recommendations for discontinuation of therapy.    Pt will have phone visit follow-up in 2 weeks to go over results.  She should continue Xarelto as directed.    2.   Menorrhagia.  Pt denies history of fibroids.  Labs done today 11/07/2018 reviewed and showed HB 9.8 and ferritin of 7.  Pt is recommended for iron replacement.    3.  Iron deficiency anemia.  Labs done today 11/07/2018 reviewed and showed HB 9.8 and ferritin of 7.  Pt has option for IV iron if intolerant to oral iron and due to menorrhagia.  If pt agreeable to IV iron will recommend Feraheme 510 mg IV D1 and D8.  Side effects of Feraheme include possible allergic reaction.  If treated with IV iron will have pt follow-up in 11/2018 with repeat labs.    4.  Health maintenance.  Will consider GI referral due to IDA.    40 minutes spent with more than 50% spent in review of records, counseling and coordination of care.     HPI:  38  year old female referred for evaluation due to PE.  Pt presented to ER on 10/10/2018 with complaint of shortness of breath and left lower extremity pain.  Patient had a d-dimer performed for the left lower extremity pain.  Pt has left LE doppler done on 10/10/2018 that showed  Summary: Left: Findings consistent with acute superficial vein thrombosis involving the left varicosities. There is no evidence of deep vein thrombosis in the lower extremity.  She had CTA of  chest done 10/10/2018 that showed  IMPRESSION: 1. Right lower lobe subsegmental pulmonary emboli.  No heart strain.  Pt was started on Xarelto.  She reports improvement in symptoms since starting anticoagulation.  She reports menorrhagia.  She denies history of uterine fibroids.  Pt reports this is her first blood clot.  She has taken Mini pills for OCP in the past and reports she was on hormones for IVF in 2017.  She is not currently on OCPs.  She denies any family history of thrombosis.  Pt is seen today for consultation due to PE.     Problem List There are no active problems to display for this patient.   Past Medical History Past Medical History:  Diagnosis Date  . Anemia   . PE (pulmonary thromboembolism) ([place])     Past Surgical History History reviewed. No pertinent surgical history.  Family History Family History  Problem Relation Age of Onset  . Thrombosis Neg Hx      Social History  reports that she has never smoked. She has never used smokeless tobacco. She reports that she does not drink alcohol or use drugs.  Medications  Current Outpatient Medications:  .  Rivaroxaban 15 & 20 MG TBPK, Take as directed on package: Start with one 15mg  tablet by mouth twice a day with food. On Day 22, switch to one 20mg  tablet once a day with food., Disp: 51 each, Rfl: 0  Allergies Patient has no known allergies.  Review of Systems Review of Systems - Oncology ROS negative   Physical Exam  Vitals Wt Readings from Last 3 Encounters:  11/07/18 152 lb 11.2 oz (69.3 kg)  10/10/18 152 lb (68.9 kg)   Temp Readings from Last 3 Encounters:  11/07/18 99.8 F (37.7 C) (Oral)  10/10/18 97.9 F (36.6 C) (Oral)  04/22/13 98.9 F (37.2 C) (Oral)   BP Readings from Last 3 Encounters:  11/07/18 106/62  10/10/18 112/82  04/22/13 114/76   Pulse Readings from Last 3 Encounters:  11/07/18 62  10/10/18 72  04/22/13 79   Constitutional: Well-developed, well-nourished, and in no  distress.   HENT: Head: Normocephalic and atraumatic.  Mouth/Throat: No oropharyngeal exudate. Mucosa moist. Eyes: Pupils are equal, round, and reactive to light. Conjunctivae are normal. No scleral icterus.  Neck: Normal range of motion. Neck supple. No JVD present.  Cardiovascular: Normal rate, regular rhythm and normal heart sounds.  Exam reveals no gallop and no friction rub.   No murmur heard. Pulmonary/Chest: Effort normal and breath sounds normal. No respiratory distress. No wheezes.No rales.  Abdominal: Soft. Bowel sounds are normal. No distension. There is no tenderness. There is no guarding.  Musculoskeletal: No edema or tenderness.  Lymphadenopathy: No cervical,axillary or supraclavicular adenopathy.  Neurological: Alert and oriented to person, place, and time. No cranial nerve deficit.  Skin: Skin is warm and dry. No rash noted. No erythema. No pallor.  Psychiatric: Affect and judgment normal.   Labs Appointment on 11/07/2018  Component Date Value Ref Range Status  .  Iron 11/07/2018 31* 41 - 142 ug/dL Final  . TIBC 11/07/2018 411  236 - 444 ug/dL Final  . Saturation Ratios 11/07/2018 8* 21 - 57 % Final  . UIBC 11/07/2018 380  120 - 384 ug/dL Final   Performed at [facility], [place] [address], [place], [place] [address]  . Ferritin 11/07/2018 7* 11 - 307 ng/mL Final   Performed at [facility], [place] [address] [place], [place] [address]  . LDH 11/07/2018 132  98 - 192 U/L Final   Performed at [facility], [place] [address] [place], [place] [address]  . Sodium 11/07/2018 138  135 - 145 mmol/L Final  . Potassium 11/07/2018 4.3  3.5 - 5.1 mmol/L Final  . Chloride 11/07/2018 107  98 - 111 mmol/L Final  . CO2 11/07/2018 26  22 - 32 mmol/L Final  . Glucose, Bld 11/07/2018 92  70 - 99 mg/dL Final  . BUN 11/07/2018 7  6 - 20 mg/dL Final  . Creatinine 11/07/2018 0.78  0.44 - 1.00 mg/dL Final  . Calcium  11/07/2018 8.8* 8.9 - 10.3 mg/dL Final  . Total Protein 11/07/2018 7.1  6.5 - 8.1 g/dL Final  . Albumin 11/07/2018 3.9  3.5 - 5.0 g/dL Final  . AST 11/07/2018 21  15 - 41 U/L Final  . ALT 11/07/2018 29  0 - 44 U/L Final  . Alkaline Phosphatase 11/07/2018 64  38 - 126 U/L Final  . Total Bilirubin 11/07/2018 0.4  0.3 - 1.2 mg/dL Final  . GFR, Est Non Af Am 11/07/2018 >60  >60 mL/min Final  . GFR, Est AFR Am 11/07/2018 >60  >60 mL/min Final  . Anion gap 11/07/2018 5  5 - 15 Final   Performed at [facility], [place] [address] [place], [place] [address]  . WBC Count 11/07/2018 3.2* 4.0 - 10.5 K/uL Final  . RBC 11/07/2018 3.59* 3.87 - 5.11 MIL/uL Final  . Hemoglobin 11/07/2018 9.8* 12.0 - 15.0 g/dL Final  . HCT 11/07/2018 30.9* 36.0 - 46.0 % Final  . MCV 11/07/2018 86.1  80.0 - 100.0 fL Final  . MCH 11/07/2018 27.3  26.0 - 34.0 pg Final  . MCHC 11/07/2018 31.7  30.0 - 36.0 g/dL Final  . RDW 11/07/2018 13.2  11.5 - 15.5 % Final  . Platelet Count 11/07/2018 282  150 - 400 K/uL Final  . nRBC 11/07/2018 0.0  0.0 - 0.2 % Final  . Neutrophils Relative % 11/07/2018 33  % Final  . Neutro Abs 11/07/2018 1.1* 1.7 - 7.7 K/uL Final  . Lymphocytes Relative 11/07/2018 43  % Final  . Lymphs Abs 11/07/2018 1.4  0.7 - 4.0 K/uL Final  . Monocytes Relative 11/07/2018 9  % Final  . Monocytes Absolute 11/07/2018 0.3  0.1 - 1.0 K/uL Final  . Eosinophils Relative 11/07/2018 14  % Final  . Eosinophils Absolute 11/07/2018 0.5  0.0 - 0.5 K/uL Final  . Basophils Relative 11/07/2018 1  % Final  . Basophils Absolute 11/07/2018 0.0  0.0 - 0.1 K/uL Final  . Immature Granulocytes 11/07/2018 0  % Final  . Abs Immature Granulocytes 11/07/2018 0.00  0.00 - 0.07 K/uL Final   Performed at [facility], [place] [address] [place], [place] [address]     Pathology Orders Placed This Encounter  Procedures  . CT Abdomen Pelvis W Contrast    Standing Status:   Future  Standing  Expiration Date:   11/07/2019    Order Specific Question:   If indicated for the ordered procedure, I authorize the administration of contrast media per Radiology protocol    Answer:   Yes    Order Specific Question:   Is patient pregnant?    Answer:   No    Order Specific Question:   Preferred imaging location?    Answer:   [facility]    Order Specific Question:   Is Oral Contrast requested for this exam?    Answer:   Yes, Per Radiology protocol    Order Specific Question:   Radiology Contrast Protocol - do NOT remove file path    Answer:   [email or web address]  . CBC with Differential ([place] Only)    Standing Status:   Future    Number of Occurrences:   1    Standing Expiration Date:   11/07/2019  . CMP ([place] only)    Standing Status:   Future    Number of Occurrences:   1    Standing Expiration Date:   11/07/2019  . Lactate dehydrogenase (LDH)    Standing Status:   Future    Number of Occurrences:   1    Standing Expiration Date:   11/07/2019  . Ferritin    Standing Status:   Future    Number of Occurrences:   1    Standing Expiration Date:   11/07/2019  . Iron and TIBC    Standing Status:   Future    Number of Occurrences:   1    Standing Expiration Date:   11/07/2019  . Factor 5 Leiden*    Standing Status:   Future    Number of Occurrences:   1    Standing Expiration Date:   11/07/2019  . Prothrombin gene mutation*    Standing Status:   Future    Number of Occurrences:   1    Standing Expiration Date:   11/07/2019  . Lupus anticoagulant panel*    Standing Status:   Future    Number of Occurrences:   1    Standing Expiration Date:   11/07/2019  . Beta-2-glycoprotein i abs, IgG/M/A    Standing Status:   Future    Number of Occurrences:   1    Standing Expiration Date:   11/07/2019  . ANA, IFA (with reflex)    Standing Status:   Future    Number of Occurrences:   1    Standing Expiration Date:   11/07/2019       Zoila Shutter MD

## 2018-11-08 LAB — BETA-2-GLYCOPROTEIN I ABS, IGG/M/A
Beta-2 Glyco I IgG: <9 GPI IgG units (ref 0–20)
Beta-2-Glycoprotein I IgA: <9 GPI IgA units (ref 0–25)
Beta-2-Glycoprotein I IgM: <9 GPI IgM units (ref 0–32)

## 2018-11-10 ENCOUNTER — Telehealth: Payer: Self-pay | Admitting: Internal Medicine

## 2018-11-10 LAB — ANTINUCLEAR ANTIBODIES, IFA: ANA Ab, IFA: NEGATIVE

## 2018-11-10 NOTE — Telephone Encounter (Signed)
Phone visit on 11/21/18

## 2018-11-10 NOTE — Telephone Encounter (Signed)
Tried to reach regarding 5/29

## 2018-11-11 LAB — LUPUS ANTICOAGULANT PANEL
DRVVT: 115.2 s — ABNORMAL HIGH (ref 0.0–47.0)
PTT Lupus Anticoagulant: 39.6 s (ref 0.0–51.9)

## 2018-11-11 LAB — DRVVT MIX: dRVVT Mix: 73.1 s — ABNORMAL HIGH (ref 0.0–47.0)

## 2018-11-11 LAB — DRVVT CONFIRM: dRVVT Confirm: 1.8 ratio — ABNORMAL HIGH (ref 0.8–1.2)

## 2018-11-13 ENCOUNTER — Telehealth: Payer: Self-pay | Admitting: *Deleted

## 2018-11-13 LAB — PROTHROMBIN GENE MUTATION

## 2018-11-13 LAB — FACTOR 5 LEIDEN

## 2018-11-13 NOTE — Telephone Encounter (Signed)
Received call from pt requesting to cancel/postpone her CT scan until after all her lab work from last week is back and has been reviewed with her by Dr. Walden Field. Also she does not want her CT scans done @ [facility] as her co-pat is much too high and she cannot afford it.  She is going to check the cost @ [facility].  CT Scan cancelled Pt has appt with Dr. Walden Field on 11/21/18  Please advise further.

## 2018-11-14 ENCOUNTER — Ambulatory Visit (HOSPITAL_COMMUNITY): Admission: RE | Admit: 2018-11-14 | Payer: BC Managed Care – PPO | Source: Ambulatory Visit

## 2018-11-21 ENCOUNTER — Inpatient Hospital Stay (HOSPITAL_BASED_OUTPATIENT_CLINIC_OR_DEPARTMENT_OTHER): Payer: BC Managed Care – PPO | Admitting: Internal Medicine

## 2018-11-21 DIAGNOSIS — I2699 Other pulmonary embolism without acute cor pulmonale: Secondary | ICD-10-CM

## 2018-11-21 DIAGNOSIS — Z7901 Long term (current) use of anticoagulants: Secondary | ICD-10-CM | POA: Diagnosis not present

## 2018-11-21 DIAGNOSIS — D508 Other iron deficiency anemias: Secondary | ICD-10-CM

## 2018-11-21 DIAGNOSIS — N920 Excessive and frequent menstruation with regular cycle: Secondary | ICD-10-CM

## 2018-11-21 DIAGNOSIS — D509 Iron deficiency anemia, unspecified: Secondary | ICD-10-CM

## 2018-11-21 NOTE — Progress Notes (Signed)
Virtual Visit via Telephone Note  I connected with Tanya Rangel on 11/21/18 at  9:10 AM EDT by telephone and verified that I am speaking with the correct person using two identifiers.   I discussed the limitations, risks, security and privacy concerns of performing an evaluation and management service by telephone and the availability of in person appointments. I also discussed with the patient that there may be a patient responsible charge related to this service. The patient expressed understanding and agreed to proceed.  Interval History:  Historical data obtained from note dated 11/07/2018.  38 year old female referred for evaluation due to PE.  Pt presented to ER on 10/10/2018 with complaint of shortness of breath and left lower extremity pain.  Patient had a d-dimer performed for the left lower extremity pain.  Pt has left LE doppler done on 10/10/2018 that showed  Summary: Left: Findings consistent with acute superficial vein thrombosis involving the left varicosities. There is no evidence of deep vein thrombosis in the lower extremity.  She had CTA of chest done 10/10/2018 that showed  IMPRESSION: 1. Right lower lobe subsegmental pulmonary emboli.  No heart strain.  Pt was started on Xarelto.  She reports improvement in symptoms since starting anticoagulation.  She reports menorrhagia.  She denies history of uterine fibroids.  Pt reports this is her first blood clot.  She has taken Mini pills for OCP in the past and reports she was on hormones for IVF in 2017.  She is not currently on OCPs.  She denies any family history of thrombosis.    Observations/Objective: Review of labs from 11/07/2018.     Assessment and Plan:58.  PE.  38 year old female referred for evaluation due to PE.  Pt presented to ER on 10/10/2018 with complaint of shortness of breath and left lower extremity pain.  Patient had a d-dimer performed for the left lower extremity pain.  Pt has left LE doppler done on 10/10/2018 that  showed  Summary: Left: Findings consistent with acute superficial vein thrombosis involving the left varicosities. There is no evidence of deep vein thrombosis in the lower extremity.  She had CTA of chest done 10/10/2018 that showed  IMPRESSION: 1. Right lower lobe subsegmental pulmonary emboli.  No heart strain.  Pt was started on Xarelto.  She reports improvement in symptoms since starting anticoagulation.  She reports menorrhagia.  She denies history of uterine fibroids.  Pt reports this is her first blood clot.  She has taken Mini pills for OCP in the past and reports she was on hormones for IVF in 2017.  She is not currently on OCPs.  She denies any family history of thrombosis.    Labs done 11/07/2018 reviewed and showed WBC 3.2 HB 9.8 plts 282,000.  Chemistries WNL with K+ 4.3 Cr 0.78 and normal LFTs.  Pt has negative factor V leiden and Prothrombin gene mutation, and normal B2 GP ab.  Lupus anticoagulant testing showed  Results are consistent with the presence of a lupus anticoagulant.  NOTE: Only persistent lupus anticoagulants are thought to be of  clinical  significance. For this reason, repeat testing in 12 or more weeks  after an  initial positive result should be considered to confirm or refute the  presence of a lupus anticoagulant, depending on clinical presentation.  Results of lupus anticoagulant tests may be falsely positive in the  presence of certain anticoagulant therapies.    Test likely affected by Xarelto.  Will repeat testing at  later date.  Previously I discussed risk factors for thrombosis which include recent surgery, extensive travel and hormonal therapy.  Pt has taken hormonal treatments in the past.  She was set up for CT of abdomen and pelvis due to complaints of menorrhagia and to evaluate for any evidence of occult malignancy. Pt has not had imaging done.  Will discuss with pt once scans performed.   I have discussed with her she is recommended for 6 months of  anticoagulation with repeat imaging to assess resolution of thrombosis prior to recommendations for discontinuation of therapy.  Pt will RTC in 03/2019 with labs and bilateral LE doppler and CTA of chest.  She should continue Xarelto as directed.    2.   Menorrhagia.  Pt denies history of fibroids.  Labs done 11/07/2018 reviewed and showed HB 9.8 and ferritin of 7.  Pt is recommended for iron replacement.  Pt should follow-up with GYN as recommended.    3.  Iron deficiency anemia.  Likely due to menorrhagia.  Labs done 11/07/2018 reviewed and showed HB 9.8 and ferritin of 7.  Pt has option for IV iron but desired to try oral iron.   Pt will have repeat labs in 03/2019.   4.  Health maintenance.  If no improvement in labs will refer to GI.    Follow Up Instructions:  Labs in 03/2019.    I discussed the assessment and treatment plan with the patient. The patient was provided an opportunity to ask questions and all were answered. The patient agreed with the plan and demonstrated an understanding of the instructions.   The patient was advised to call back or seek an in-person evaluation if the symptoms worsen or if the condition fails to improve as anticipated.  I provided 15 minutes of non-face-to-face time during this encounter.   Zoila Shutter, MD

## 2018-11-24 ENCOUNTER — Telehealth: Payer: Self-pay | Admitting: Internal Medicine

## 2018-11-24 NOTE — Telephone Encounter (Signed)
Will schedule for October when template is available

## 2019-03-18 ENCOUNTER — Telehealth: Payer: Self-pay | Admitting: Hematology

## 2019-03-18 NOTE — Telephone Encounter (Signed)
[facility] transfer to [place]. Confirmed with patient October appointments. Doppler already on schedule. Ct scan pending.

## 2019-03-25 ENCOUNTER — Telehealth: Payer: Self-pay

## 2019-03-25 NOTE — Telephone Encounter (Signed)
Received call from Butch Penny with [facility] regarding appointment patient had for Monday for vascular study.  When pre-cert department called the patient she stated she wanted to cancel her appointment stating she did not need it.

## 2019-03-25 NOTE — Telephone Encounter (Signed)
She was Dr. Max Sane pt, OK to cancel per her request and I will discuss with her when I see her next month. Thanks   Truitt Merle MD

## 2019-03-29 ENCOUNTER — Other Ambulatory Visit: Payer: Self-pay | Admitting: Internal Medicine

## 2019-03-30 ENCOUNTER — Ambulatory Visit (HOSPITAL_COMMUNITY): Payer: BC Managed Care – PPO

## 2019-03-30 NOTE — Telephone Encounter (Signed)
Former pt of Dr. Walden Field.  Refill request

## 2019-04-09 ENCOUNTER — Inpatient Hospital Stay: Payer: BC Managed Care – PPO | Attending: Hematology

## 2019-04-09 ENCOUNTER — Ambulatory Visit (HOSPITAL_COMMUNITY)
Admission: RE | Admit: 2019-04-09 | Discharge: 2019-04-09 | Disposition: A | Discharge status: Home / Self Care | Payer: BC Managed Care – PPO | Source: Ambulatory Visit | Attending: Internal Medicine | Admitting: Internal Medicine

## 2019-04-09 ENCOUNTER — Other Ambulatory Visit: Payer: Self-pay

## 2019-04-09 DIAGNOSIS — I8290 Acute embolism and thrombosis of unspecified vein: Secondary | ICD-10-CM | POA: Insufficient documentation

## 2019-04-09 DIAGNOSIS — I2699 Other pulmonary embolism without acute cor pulmonale: Secondary | ICD-10-CM | POA: Insufficient documentation

## 2019-04-09 DIAGNOSIS — D508 Other iron deficiency anemias: Secondary | ICD-10-CM | POA: Insufficient documentation

## 2019-04-09 DIAGNOSIS — Z7901 Long term (current) use of anticoagulants: Secondary | ICD-10-CM | POA: Diagnosis not present

## 2019-04-09 DIAGNOSIS — D500 Iron deficiency anemia secondary to blood loss (chronic): Secondary | ICD-10-CM | POA: Diagnosis present

## 2019-04-09 DIAGNOSIS — N920 Excessive and frequent menstruation with regular cycle: Secondary | ICD-10-CM | POA: Insufficient documentation

## 2019-04-09 LAB — CBC WITH DIFFERENTIAL (CANCER CENTER ONLY)
Abs Immature Granulocytes: 0 10*3/uL (ref 0.00–0.07)
Basophils Absolute: 0 10*3/uL (ref 0.0–0.1)
Basophils Relative: 1 %
Eosinophils Absolute: 0.5 10*3/uL (ref 0.0–0.5)
Eosinophils Relative: 13 %
HCT: 35.9 % — ABNORMAL LOW (ref 36.0–46.0)
Hemoglobin: 11.6 g/dL — ABNORMAL LOW (ref 12.0–15.0)
Immature Granulocytes: 0 %
Lymphocytes Relative: 47 %
Lymphs Abs: 1.7 10*3/uL (ref 0.7–4.0)
MCH: 28.4 pg (ref 26.0–34.0)
MCHC: 32.3 g/dL (ref 30.0–36.0)
MCV: 87.8 fL (ref 80.0–100.0)
Monocytes Absolute: 0.3 10*3/uL (ref 0.1–1.0)
Monocytes Relative: 7 %
Neutro Abs: 1.2 10*3/uL — ABNORMAL LOW (ref 1.7–7.7)
Neutrophils Relative %: 32 %
Platelet Count: 280 10*3/uL (ref 150–400)
RBC: 4.09 MIL/uL (ref 3.87–5.11)
RDW: 14.1 % (ref 11.5–15.5)
WBC Count: 3.6 10*3/uL — ABNORMAL LOW (ref 4.0–10.5)
nRBC: 0 % (ref 0.0–0.2)

## 2019-04-09 LAB — LACTATE DEHYDROGENASE: LDH: 141 U/L (ref 98–192)

## 2019-04-09 LAB — CMP (CANCER CENTER ONLY)
ALT: 23 U/L (ref 0–44)
AST: 21 U/L (ref 15–41)
Albumin: 4.1 g/dL (ref 3.5–5.0)
Alkaline Phosphatase: 59 U/L (ref 38–126)
Anion gap: 8 (ref 5–15)
BUN: 5 mg/dL — ABNORMAL LOW (ref 6–20)
CO2: 27 mmol/L (ref 22–32)
Calcium: 9 mg/dL (ref 8.9–10.3)
Chloride: 105 mmol/L (ref 98–111)
Creatinine: 0.76 mg/dL (ref 0.44–1.00)
GFR, Est AFR Am: >60 mL/min (ref >60)
GFR, Estimated: >60 mL/min (ref >60)
Glucose, Bld: 90 mg/dL (ref 70–99)
Potassium: 4.1 mmol/L (ref 3.5–5.1)
Sodium: 140 mmol/L (ref 135–145)
Total Bilirubin: 0.4 mg/dL (ref 0.3–1.2)
Total Protein: 7 g/dL (ref 6.5–8.1)

## 2019-04-09 LAB — FERRITIN: Ferritin: 37 ng/mL (ref 11–307)

## 2019-04-09 MED ORDER — IOHEXOL 350 MG/ML SOLN
100.0000 mL | Freq: Once | INTRAVENOUS | Status: AC | PRN
  Administered 2019-04-09: 100 mL via INTRAVENOUS

## 2019-04-09 MED ORDER — SODIUM CHLORIDE (PF) 0.9 % IJ SOLN
INTRAMUSCULAR | Status: AC
  Filled 2019-04-09: qty 50

## 2019-04-10 ENCOUNTER — Inpatient Hospital Stay (HOSPITAL_BASED_OUTPATIENT_CLINIC_OR_DEPARTMENT_OTHER): Payer: BC Managed Care – PPO | Admitting: Nurse Practitioner

## 2019-04-10 ENCOUNTER — Other Ambulatory Visit: Payer: Self-pay

## 2019-04-10 ENCOUNTER — Encounter: Payer: Self-pay | Admitting: Nurse Practitioner

## 2019-04-10 VITALS — BP 108/69 | HR 77 | Temp 98.0°F | Resp 16 | Ht 65.0 in | Wt 147.4 lb

## 2019-04-10 DIAGNOSIS — D508 Other iron deficiency anemias: Secondary | ICD-10-CM

## 2019-04-10 DIAGNOSIS — D500 Iron deficiency anemia secondary to blood loss (chronic): Secondary | ICD-10-CM | POA: Diagnosis not present

## 2019-04-10 LAB — LUPUS ANTICOAGULANT PANEL
DRVVT: 35.3 s (ref 0.0–47.0)
PTT Lupus Anticoagulant: 31.4 s (ref 0.0–51.9)

## 2019-04-10 MED ORDER — FERROUS SULFATE 325 (65 FE) MG PO TABS: 325.0000 mg | ORAL_TABLET | Freq: Every day | ORAL | 3 refills | Status: DC

## 2019-04-10 NOTE — Progress Notes (Addendum)
[facility] OFFICE PROGRESS NOTE   Diagnosis: Acute superficial vein thrombosis involving left varicosities/PE 10/10/2018, iron deficiency anemia  INTERVAL HISTORY:   Tanya Rangel is a 38 year old woman who presented to the emergency department on 10/10/2018 with left leg pain.  D-dimer as an outpatient noted to be elevated (we do not have a copy of this report).  She also complained of shortness of breath.  Left lower extremity venous Doppler showed acute superficial vein thrombosis involving left varicosities.  No evidence of deep vein thrombosis.  CT chest showed right lower lobe segmental pulmonary emboli.  No heart strain.  She was discharged home on Xarelto.  She was seen by Dr. Walden Field as a new patient on 11/07/2018.  She reported improvement in symptoms at the time of that visit.  Hyper coag labs showed negative factor V Leiden gene mutation; negative prothrombin gene mutation; PTT lupus anticoagulant normal at 39.6 seconds, DRVVT elevated at 115.2 seconds; beta-2 glycoprotein 1 antibodies in normal range; ANA negative.  Additional labs showed ferritin 7, hemoglobin 9.8, white count 3.2, ANC 1.1, MCV 86.1, platelets 282, BUN 7, creatinine 0.78, bilirubin 0.4.  She was referred for CTs of the abdomen and pelvis due to complaints of menorrhagia and to evaluate for evidence of occult malignancy.  She decided not to do the scan.  The positive lupus anticoagulant was felt to likely be related to Xarelto with the plan to repeat testing at a later date.  This was done yesterday with results returning normal.  Dr. Walden Field recommended 6 months of anticoagulation with repeat imaging to assess for resolution of thrombosis prior to recommendations for discontinuation of therapy.  She did not have the lower extremity Doppler done but did have a chest CT on 04/09/2019 which showed interval resolution of right-sided pulmonary embolism.  For the iron deficiency which was felt to likely be due to  menorrhagia she opted for oral iron.  Repeat labs yesterday showed hemoglobin 11.6, MCV 87.8, ferritin 37.  She has no significant past medical history.  She lives in [place].  She has an 61 year old son.  She works as a Designer, jewellery.  No tobacco use.  Rare alcohol intake.  No family history of blood clots or malignancy.    She feels well.  She has no shortness of breath.  No leg pain.  She discontinued Xarelto about 10 days ago.  She denies any risk factors for a blood clot around the time of the PE.  She does not take oral contraceptives.  She is very active, runs long distances.  She reports recent intentional weight loss with intermittent fasting.  She has a good appetite.  No fevers or sweats.  No cough.  No change in bowel habits.  She had some nausea yesterday and today.  She wonders if this is related to the CT contrast.  She denies bleeding except related to her monthly menstrual cycle.  She notes the bleeding has been worse while on Xarelto.  She continues oral iron and is tolerating this well.  Specifically no constipation.  No recurrent infections.  She reports being aware that her white count runs low.  Objective:  Vital signs in last 24 hours:  Blood pressure 108/69, pulse 77, temperature 98 F (36.7 C), temperature source Temporal, resp. rate 16, height 5\' 5"  (1.651 m), weight 147 lb 6.4 oz (66.9 kg), last menstrual period 04/02/2019, SpO2 100 %.    HEENT: Neck without mass. Lymphatics: No palpable cervical, supraclavicular or axillary  lymph nodes. Resp: Lungs clear bilaterally. Cardio: Regular rate and rhythm. GI: Abdomen soft and nontender.  No hepatosplenomegaly. Vascular: No leg edema.  Calves soft and nontender. Neuro: Alert and oriented.   Lab Results:  Lab Results  Component Value Date   WBC 3.6 (L) 04/09/2019   HGB 11.6 (L) 04/09/2019   HCT 35.9 (L) 04/09/2019   MCV 87.8 04/09/2019   PLT 280 04/09/2019   NEUTROABS 1.2 (L) 04/09/2019     Imaging:  Ct Angio Chest Pe W Or Wo Contrast  Result Date: 04/09/2019 CLINICAL DATA:  History of previous pulmonary embolus. Recently discontinued anticoagulation medicine. EXAM: CT ANGIOGRAPHY CHEST WITH CONTRAST TECHNIQUE: Multidetector CT imaging of the chest was performed using the standard protocol during bolus administration of intravenous contrast. Multiplanar CT image reconstructions and MIPs were obtained to evaluate the vascular anatomy. CONTRAST:  12mL OMNIPAQUE IOHEXOL 350 MG/ML SOLN COMPARISON:  October 10, 2018 FINDINGS: Cardiovascular: There has been interval resolution of right-sided pulmonary embolism. Currently no pulmonary emboli are evident. There is no appreciable thoracic aortic aneurysm or dissection. Visualized great vessels appear unremarkable. There is no evident pericardial effusion or pericardial thickening. Mediastinum/Nodes: Visualized thyroid appears normal. There is no evident thoracic adenopathy. No esophageal lesions are appreciable. Lungs/Pleura: There is a minimal right pleural effusion with slight posterior right base atelectasis. Lungs elsewhere clear. Upper Abdomen: There is reflux of contrast into the inferior vena cava and slight reflux into hepatic veins. Visualized upper abdominal structures otherwise appear unremarkable. Musculoskeletal: There are no blastic or lytic bone lesions. No chest wall lesions evident. Review of the MIP images confirms the above findings. IMPRESSION: 1. No demonstrable pulmonary embolus. The previously noted pulmonary emboli have resolved. 2.  No thoracic aortic aneurysm or dissection. 3. Mild reflux of contrast in the inferior vena cava and hepatic veins may be indicative of a degree of increase in right heart pressure. 4. Minimal right pleural effusion with mild posterior right base atelectasis. Lungs elsewhere clear. 5.  No evident adenopathy. Electronically Signed   By: Lowella Grip III M.D.   On: 04/09/2019 13:44     Medications: I have reviewed the patient's current medications.  Assessment/Plan: 1. Acute superficial vein thrombosis involving left varicosities/pulmonary embolus 10/10/2018-unprovoked  Xarelto initiated 10/10/2018  Discontinued per patient after completing approximately 6 months (early October 2020) 2. Positive lupus anticoagulant 11/07/2018 (on Xarelto)  Negative lupus anticoagulant 04/09/2019 (off Xarelto greater than 1 week) 3. Iron deficiency anemia-on oral iron, improved 4. Menorrhagia  Disposition: Tanya Rangel is a 38 year old woman with no significant past medical history who was diagnosed with an unprovoked pulmonary embolus and acute superficial vein thrombosis involving left lower extremity varicosities April 2020.  No identifiable risk factors.  She has completed approximately 6 months of Xarelto anticoagulation.  Repeat lupus anticoagulant negative.  Repeat chest CT with resolution of previous pulmonary emboli.  Dr. Burr Medico recommends discontinuation of Xarelto at this time.  She will begin aspirin 81 mg daily.  She does not smoke and will continue to avoid this.  She will remain active, especially during periods of travel.  She understands she should not take oral contraceptives.  With regard to the iron deficiency, hemoglobin has improved on oral iron.  She will continue the same.  We did not schedule formal follow-up in our office.  We are available to see her in the future as needed.  She is aware of signs/symptoms associated with PE/DVT and understands to seek evaluation if these occur.  Patient seen with  Dr. Burr Medico.    Ned Card ANP/GNP-BC   04/10/2019  2:41 PM    Addendum  I have seen the patient, examined her. I agree with the assessment and and plan and have edited the notes.   Tanya Rangel has one episode of unprovoked PE in the right lower lobe segment in 08/2018, and she was treated with 6 months Xarelto.  Repeat a CT chest yesterday was negative for PE.   She is clinically doing very well.  I reviewed her lab results, hypercoagulopathy work-up was negative except lupus anticoagulant was positive initially, but negative on repeated testing yesterday.  She has no family history of thrombosis, or other risk factors.  Given the small thrombosis burden when she has PE, no other risk factors for thrombosis, and her heavy menstrual period related iron deficiency and a mild anemia, I recommend stop anticoagulation, and consider aspirin 81mg  daily.  However if she develops a second episode of thrombosis, I would recommend lifelong anticoagulation.  She voiced good understanding, and agrees with the plan.  She will follow-up with her PCP.  Truitt Merle  04/10/2019

## 2019-06-11 ENCOUNTER — Other Ambulatory Visit: Payer: Self-pay | Admitting: Family Medicine

## 2019-06-11 ENCOUNTER — Other Ambulatory Visit (HOSPITAL_COMMUNITY)
Admission: RE | Admit: 2019-06-11 | Discharge: 2019-06-11 | Disposition: A | Discharge status: Home / Self Care | Payer: BC Managed Care – PPO | Source: Ambulatory Visit | Attending: Family Medicine | Admitting: Family Medicine

## 2019-06-11 DIAGNOSIS — Z124 Encounter for screening for malignant neoplasm of cervix: Secondary | ICD-10-CM | POA: Diagnosis not present

## 2019-06-15 LAB — CYTOLOGY - PAP: Diagnosis: NEGATIVE

## 2020-03-10 ENCOUNTER — Ambulatory Visit: Payer: BC Managed Care – PPO | Attending: Family

## 2020-03-10 DIAGNOSIS — Z23 Encounter for immunization: Secondary | ICD-10-CM

## 2020-04-01 ENCOUNTER — Telehealth: Payer: Self-pay | Admitting: Nurse Practitioner

## 2020-04-01 NOTE — Telephone Encounter (Signed)
Scheduled per sch msg. Called and left a msg.  

## 2020-04-01 NOTE — Telephone Encounter (Signed)
I spoke to Tanya Rangel.  She reports receiving the first Moderna vaccine on 03/10/2020.  About 36 hours after the vaccine she developed low back/hip pain and chills.  This lasted for 2 days and then resolved.  She subsequently developed extreme fatigue.  She denies shortness of breath, chest pain, leg swelling, calf pain.  She confirms she is taking Xarelto.  We will see her in follow-up next week.  She will contact us prior to the appointment with concerns.

## 2020-04-01 NOTE — Telephone Encounter (Signed)
I spoke to Dr. Everlene Farrier.  He reports Tanya Rangel received the Covid vaccine 3 weeks ago.  Since then she has developed severe fatigue, no chest pain and no shortness of breath.  No signs of pulmonary embolus.  D-dimer and sed rate both slightly elevated.  Hemoglobin and white count both mildly decreased.  She was put on Xarelto due to concern for low-grade clotting.  No imaging studies have been done.  I requested her labs be faxed over to our office.  I discussed the above with Dr. Burr Medico.  We will make arrangements for an appointment next week.

## 2020-04-03 NOTE — Progress Notes (Signed)
   Covid-[facility] Vaccination [facility]  Name:  Tanya Rangel    MRN: [id number] DOB: 04-Oct-1980  04/03/2020  [name] was observed post Covid-19 immunization for 15 minutes without incident. She was provided with Vaccine Information Sheet and instruction to access the V-Safe system.   [name] was instructed to call 911 with any severe reactions post vaccine: Marland Kitchen Difficulty breathing  . Swelling of face and throat  . A fast heartbeat  . A bad rash all over body  . Dizziness and weakness   Immunizations Administered    Name Date Dose VIS Date Route   Moderna COVID-19 Vaccine 03/10/2020  5:00 PM 0.5 mL 05/2019 Intramuscular   Manufacturer: Moderna   Lot: [id number]   [place]: [id number]

## 2020-04-04 NOTE — Progress Notes (Addendum)
Tanya Rangel   [phone] [phone]   Clinic Follow up Note   Patient Care Team: Kelton Pillar, MD as PCP - General (Family Medicine) 04/05/2020  CHIEF COMPLAINT: F/u post-covid vaccine fatigue, h/o PE  CURRENT THERAPY: observation  INTERVAL HISTORY: Tanya Rangel returns for follow-up.  She was last seen in 03/2019 as a new patient for history of unprovoked PE s/p 6 months of anticoagulation.  Her maculopathy work-up initially showed positive lupus anticoagulant but negative on repeated study.  She has been in her usual state of health until she developed COVID-19 positive infection in 08/2019, symptoms were dizziness, fatigue, and chest pain.  She feels her case was mild, resolved with homeopathic interventions.  She received the first Moderna vaccine on 03/10/2020.  36 hours later she developed significant low back pain making it difficult to walk and chills.  Symptoms resolved and now she has severe fatigue.  She remains able to work, do all her ADLs, and run at 10-15 miles twice per week which is her baseline.  Her fatigue is mostly after exercise.  She had left ankle swelling for 1-2 weeks which eventually resolved with Ace wrap and Epsom salt interventions.  She was seen by PCP with labs, CBC showed mild neutropenia and anemia which is her baseline.  Sed rate slightly elevated to 33, D-dimer slightly elevated 0.73.  No clotting studies such as Doppler or CTA were done.  She was put back on Xarelto but only took 3 days then stopped.  Also notably she has been noncompliant with oral iron in the past but recently started back on ferrous sulfate once daily.  Today her fatigue is improving, she denies cough, chest pain, dyspnea, leg edema, calf pain, or other new concerns.  MEDICAL HISTORY:  Past Medical History:  Diagnosis Date  . Anemia   . PE (pulmonary thromboembolism) ([place])     SURGICAL HISTORY: No past surgical history on file.  I have reviewed the  social history and family history with the patient and they are unchanged from previous note.  ALLERGIES:  has No Known Allergies.  MEDICATIONS:  Current Outpatient Medications  Medication Sig Dispense Refill  . ferrous sulfate 325 (65 FE) MG tablet TAKE 1 TABLET BY MOUTH EVERY DAY WITH BREAKFAST 30 tablet 3  . Multiple Vitamins-Minerals (MULTIVITAMIN ADULT EXTRA C PO) Take 1 capsule by mouth daily.    . rivaroxaban (XARELTO) 20 MG TABS tablet Take 20 mg by mouth daily with supper.     No current facility-administered medications for this visit.    PHYSICAL EXAMINATION:   Vitals:   04/05/20 0820  BP: 112/60  Pulse: 62  Resp: 20  Temp: 97.8 F (36.6 C)  SpO2: 100%   Filed Weights   04/05/20 0820  Weight: 152 lb 6.4 oz (69.1 kg)    GENERAL:alert, no distress and comfortable SKIN: No rash EYES: sclera clear NECK: Without mass LYMPH:  no palpable cervical or supraclavicular lymphadenopathy  LUNGS: clear with normal breathing effort HEART: regular rate & rhythm, no lower extremity edema NEURO: alert & oriented x 3 with fluent speech, no focal motor/sensory deficits  LABORATORY DATA:  I have reviewed the data as listed CBC Latest Ref Rng & Units 04/09/2019 11/07/2018 10/10/2018  WBC 4.0 - 10.5 K/uL 3.6(L) 3.2(L) 5.0  Hemoglobin 12.0 - 15.0 g/dL 11.6(L) 9.8(L) 11.7(L)  Hematocrit 36 - 46 % 35.9(L) 30.9(L) 37.4  Platelets 150 - 400 K/uL 280 282 224     CMP Latest  Ref Rng & Units 04/09/2019 11/07/2018 10/10/2018  Glucose 70 - 99 mg/dL 90 92 101(H)  BUN 6 - 20 mg/dL 5(L) 7 8  Creatinine 0.44 - 1.00 mg/dL 0.76 0.78 1.08(H)  Sodium 135 - 145 mmol/L 140 138 139  Potassium 3.5 - 5.1 mmol/L 4.1 4.3 4.2  Chloride 98 - 111 mmol/L 105 107 107  CO2 22 - 32 mmol/L 27 26 20(L)  Calcium 8.9 - 10.3 mg/dL 9.0 8.8(L) 9.6  Total Protein 6.5 - 8.1 g/dL 7.0 7.1 -  Total Bilirubin 0.3 - 1.2 mg/dL 0.4 0.4 -  Alkaline Phos 38 - 126 U/L 59 64 -  AST 15 - 41 U/L 21 21 -  ALT 0 - 44 U/L 23  29 -      RADIOGRAPHIC STUDIES: I have personally reviewed the radiological images as listed and agreed with the findings in the report. No results found.    ASSESSMENT/PLAN: 39 yo female   1. Severe fatigue, post covid19 Moderna vaccine  -s/p covid19 infection in 08/2019, sx include dizziness, chest pain, fatigue. Recovered completely with homeopathic remedies.  -first moderna covid19 vaccine 9/16, 36 hours later she developed low back pain, chills and left ankle swelling which eventually resolved, now she has persistent severe fatigue -Per PCP labs showed mildly elevated sed rate 33 and D-dimer 0.73 (nonspecific).CBC showed mild neutropenia and anemia which were stable at her baseline.  She was put back on Xarelto but only completed 3 days -No Doppler or VQ scan/CTA were obtained  2. H/o Acute superficial vein thrombosis involving left varicosities/pulmonary embolus 10/10/2018-unprovoked -work up showed positive lupus anticoagulant initially (on xarelto) on 11/07/18 -completed xarelto 10/10/18 - 03/2019 -repeat work up showed negative lupus anticoagulant on 04/09/19 after a week off xarelto  3. IDA secondary to menorrhagia  -Ferritin 5 on 11/07/2018, improved to 37 in 03/2019  -inconsistent with oral iron, has restarted now once daily   Disposition: Tanya Rangel appears well.  After first Covid vaccine on 9/16 she developed fatigue, arthralgia, chills, and transient left ankle swelling, all which resolved except persistent severe fatigue.  Work-up and clinical picture were concerning to her PCP for low-grade clotting process, and the recommendation was to resume Xarelto.   She has no clinical evidence of DVT or PE.  No hypoxia on 6-minute walk test.  She stopped Xarelto after 3 days.  Based on the overall picture, okay to remain off Xarelto.  We are recommending for her to take a baby aspirin once daily, remain physically active.  It is possible she was more symptomatic after the vaccine  because she previously had the COVID-19 infection. We discussed D-dimer is nonspecific. She plans to wait to receive her second dose until her employer's deadline of 05/24/2020 which I feel is reasonable given her prolonged side effects.  We reviewed her history of iron deficiency anemia which can also contribute to fatigue.  She declined to repeat iron studies today, she has been inconsistent with oral iron in the past.  She is agreeable to continue iron now.  She will follow-up with PCP, we will see her back as needed in the future for signs or concern of recurrent DVT/PE, IDA, or other new needs. Patient seen with Dr. Burr Medico.  All questions were answered. The patient knows to call the clinic with any problems, questions or concerns. No barriers to learning was detected.     Alla Feeling, NP 04/05/20   Addendum  I have seen the patient, examined her. I agree  with the assessment and and plan and have edited the notes.   Patient developed fatigue, low back pain, dyspnea, arthralgia, and transient left ankle edema after first Covid vaccine on 03/10/2020. She has recovered well. No clinical high suspicion for DVT or PE.  We discussed her that her D-dimer was elevated, and it would be reasonable to get CTA of chest and Doppler of lower extremities to rule out thrombosis.  Patient declined. She is off anticoagulation, which is resonable given the low clinical suspicion for acute thrombosis. I recommend baby aspirin daily which she agrees. We also discussed management of her IDA. Will see her as needed in future.   Truitt Merle  04/05/2020

## 2020-04-05 ENCOUNTER — Inpatient Hospital Stay: Payer: BC Managed Care – PPO | Attending: Nurse Practitioner | Admitting: Nurse Practitioner

## 2020-04-05 ENCOUNTER — Other Ambulatory Visit: Payer: Self-pay

## 2020-04-05 ENCOUNTER — Encounter: Payer: Self-pay | Admitting: Nurse Practitioner

## 2020-04-05 VITALS — BP 112/60 | HR 62 | Temp 97.8°F | Resp 20 | Ht 65.0 in | Wt 152.4 lb

## 2020-04-05 DIAGNOSIS — I2699 Other pulmonary embolism without acute cor pulmonale: Secondary | ICD-10-CM

## 2020-04-05 DIAGNOSIS — Z8616 Personal history of COVID-19: Secondary | ICD-10-CM | POA: Insufficient documentation

## 2020-04-05 DIAGNOSIS — R5383 Other fatigue: Secondary | ICD-10-CM | POA: Diagnosis not present

## 2020-04-05 DIAGNOSIS — D6862 Lupus anticoagulant syndrome: Secondary | ICD-10-CM | POA: Diagnosis not present

## 2020-04-05 DIAGNOSIS — D509 Iron deficiency anemia, unspecified: Secondary | ICD-10-CM | POA: Insufficient documentation

## 2020-04-05 DIAGNOSIS — Z86718 Personal history of other venous thrombosis and embolism: Secondary | ICD-10-CM | POA: Diagnosis present

## 2020-04-05 DIAGNOSIS — Z86711 Personal history of pulmonary embolism: Secondary | ICD-10-CM | POA: Insufficient documentation

## 2020-04-07 ENCOUNTER — Telehealth: Payer: Self-pay | Admitting: Nurse Practitioner

## 2020-04-07 NOTE — Telephone Encounter (Signed)
No 10/12 los 

## 2020-04-08 ENCOUNTER — Ambulatory Visit: Payer: BC Managed Care – PPO | Attending: Family

## 2020-04-08 DIAGNOSIS — Z23 Encounter for immunization: Secondary | ICD-10-CM

## 2020-05-15 NOTE — Progress Notes (Signed)
   Covid-19 Vaccination Clinic  Name:  Tanya Rangel    MRN: [id number] DOB: 1980-08-16  05/15/2020  Tanya Rangel was observed post Covid-19 immunization for 15 minutes without incident. She was provided with Vaccine Information Sheet and instruction to access the V-Safe system.   Tanya Rangel was instructed to call 911 with any severe reactions post vaccine: Marland Kitchen Difficulty breathing  . Swelling of face and throat  . A fast heartbeat  . A bad rash all over body  . Dizziness and weakness   Immunizations Administered    Name Date Dose VIS Date Route   Moderna COVID-19 Vaccine 04/08/2020  9:30 AM 0.5 mL 04/13/2020 Intramuscular   Manufacturer: Moderna   Lot: [id number]   [place]: [id number]

## 2022-06-04 ENCOUNTER — Other Ambulatory Visit: Payer: Self-pay | Admitting: Internal Medicine

## 2022-06-04 DIAGNOSIS — Z1231 Encounter for screening mammogram for malignant neoplasm of breast: Secondary | ICD-10-CM

## 2022-06-12 ENCOUNTER — Other Ambulatory Visit: Payer: Self-pay | Admitting: Internal Medicine

## 2022-06-12 DIAGNOSIS — R928 Other abnormal and inconclusive findings on diagnostic imaging of breast: Secondary | ICD-10-CM

## 2022-07-12 ENCOUNTER — Other Ambulatory Visit: Payer: Self-pay | Admitting: Internal Medicine

## 2022-07-12 DIAGNOSIS — N920 Excessive and frequent menstruation with regular cycle: Secondary | ICD-10-CM

## 2022-07-25 ENCOUNTER — Telehealth: Payer: Self-pay | Admitting: Hematology

## 2022-07-25 ENCOUNTER — Other Ambulatory Visit: Payer: BC Managed Care – PPO

## 2022-07-25 ENCOUNTER — Ambulatory Visit
Admission: RE | Admit: 2022-07-25 | Discharge: 2022-07-25 | Disposition: A | Discharge status: Home / Self Care | Payer: BC Managed Care – PPO | Source: Ambulatory Visit | Attending: Internal Medicine | Admitting: Internal Medicine

## 2022-07-25 ENCOUNTER — Ambulatory Visit
Admission: RE | Admit: 2022-07-25 | Discharge: 2022-07-25 | Disposition: A | Discharge status: Home / Self Care | Payer: Self-pay | Source: Ambulatory Visit | Attending: Internal Medicine | Admitting: Internal Medicine

## 2022-07-25 ENCOUNTER — Other Ambulatory Visit: Payer: Self-pay | Admitting: Internal Medicine

## 2022-07-25 DIAGNOSIS — R928 Other abnormal and inconclusive findings on diagnostic imaging of breast: Secondary | ICD-10-CM

## 2022-07-25 DIAGNOSIS — N6320 Unspecified lump in the left breast, unspecified quadrant: Secondary | ICD-10-CM

## 2022-07-25 DIAGNOSIS — R921 Mammographic calcification found on diagnostic imaging of breast: Secondary | ICD-10-CM

## 2022-07-25 NOTE — Telephone Encounter (Signed)
Scheduled appt per 1/24 referral. Pt is aware of appt date and time. Pt is aware to arrive 15 mins prior to appt time and to bring and updated insurance card. Pt is aware of appt location.    Pt was scheduled at [place], however appt was cancelled per [facility] request, stating pt was established with Dr. Lennie Odor. Scheduled pt for f/u with Dr. Burr Medico per this msg, spoke to pt and confirmed that she is okay coming to [facility].

## 2022-07-27 ENCOUNTER — Ambulatory Visit: Payer: BC Managed Care – PPO

## 2022-07-31 ENCOUNTER — Other Ambulatory Visit: Payer: Self-pay

## 2022-07-31 ENCOUNTER — Ambulatory Visit
Admission: RE | Admit: 2022-07-31 | Discharge: 2022-07-31 | Disposition: A | Discharge status: Home / Self Care | Payer: BC Managed Care – PPO | Source: Ambulatory Visit | Attending: Internal Medicine | Admitting: Internal Medicine

## 2022-07-31 DIAGNOSIS — N920 Excessive and frequent menstruation with regular cycle: Secondary | ICD-10-CM

## 2022-08-02 ENCOUNTER — Ambulatory Visit
Admission: RE | Admit: 2022-08-02 | Discharge: 2022-08-02 | Disposition: A | Discharge status: Home / Self Care | Payer: BC Managed Care – PPO | Source: Ambulatory Visit | Attending: Internal Medicine | Admitting: Internal Medicine

## 2022-08-02 DIAGNOSIS — C50919 Malignant neoplasm of unspecified site of unspecified female breast: Secondary | ICD-10-CM

## 2022-08-02 DIAGNOSIS — N6320 Unspecified lump in the left breast, unspecified quadrant: Secondary | ICD-10-CM

## 2022-08-02 DIAGNOSIS — R928 Other abnormal and inconclusive findings on diagnostic imaging of breast: Secondary | ICD-10-CM

## 2022-08-02 DIAGNOSIS — R921 Mammographic calcification found on diagnostic imaging of breast: Secondary | ICD-10-CM

## 2022-08-02 HISTORY — PX: BREAST BIOPSY: SHX20

## 2022-08-02 HISTORY — DX: Malignant neoplasm of unspecified site of unspecified female breast: C50.919

## 2022-08-09 ENCOUNTER — Other Ambulatory Visit: Payer: Self-pay

## 2022-08-09 ENCOUNTER — Ambulatory Visit: Payer: Self-pay | Admitting: Nurse Practitioner

## 2022-08-13 ENCOUNTER — Other Ambulatory Visit: Payer: Self-pay | Admitting: Surgery

## 2022-08-13 DIAGNOSIS — Z853 Personal history of malignant neoplasm of breast: Secondary | ICD-10-CM

## 2022-08-14 ENCOUNTER — Ambulatory Visit: Payer: BC Managed Care – PPO | Attending: Surgery | Admitting: Physical Therapy

## 2022-08-14 ENCOUNTER — Encounter: Payer: Self-pay | Admitting: Physical Therapy

## 2022-08-14 ENCOUNTER — Other Ambulatory Visit: Payer: Self-pay

## 2022-08-14 ENCOUNTER — Other Ambulatory Visit: Payer: Self-pay | Admitting: Surgery

## 2022-08-14 ENCOUNTER — Telehealth: Payer: Self-pay | Admitting: Genetic Counselor

## 2022-08-14 DIAGNOSIS — Z853 Personal history of malignant neoplasm of breast: Secondary | ICD-10-CM

## 2022-08-14 DIAGNOSIS — Z170 Estrogen receptor positive status [ER+]: Secondary | ICD-10-CM | POA: Diagnosis present

## 2022-08-14 DIAGNOSIS — C50912 Malignant neoplasm of unspecified site of left female breast: Secondary | ICD-10-CM | POA: Diagnosis present

## 2022-08-14 DIAGNOSIS — R293 Abnormal posture: Secondary | ICD-10-CM | POA: Diagnosis present

## 2022-08-14 NOTE — Therapy (Addendum)
OUTPATIENT PHYSICAL THERAPY BREAST CANCER BASELINE EVALUATION   Patient Name: Tanya Rangel MRN: [id number] DOB:[birth year], 42 y.o., female Today's Date: 08/14/2022  END OF SESSION:  PT End of Session - 08/14/22 1529     Visit Number 1    Number of Visits 2    Date for PT Re-Evaluation 09/25/22    PT Start Time 1500    PT Stop Time 1528    PT Time Calculation (min) 28 min    Activity Tolerance Patient tolerated treatment well    Behavior During Therapy [facility] for tasks assessed/performed             Past Medical History:  Diagnosis Date   Anemia    PE (pulmonary thromboembolism) ([place])    Past Surgical History:  Procedure Laterality Date   BREAST BIOPSY Left 08/02/2022   [place] LT BREAST BX W LOC DEV 1ST LESION IMG BX SPEC US GUIDE 08/02/2022 [facility]   BREAST BIOPSY Left 08/02/2022   MM LT BREAST BX W LOC DEV 1ST LESION IMAGE BX SPEC STEREO GUIDE 08/02/2022 [facility]MO[facility]   There are no problems to display for this patient.   PCP: Kelton Pillar, MD  REFERRING PROVIDER: Coralie Keens, MD  REFERRING DIAG: [phone] (ICD-10-CM) - Malignant neoplasm of unspecified site of left female breast   THERAPY DIAG:  Abnormal posture - Plan: PT plan of care cert/re-cert  Malignant neoplasm of left breast in female, estrogen receptor positive, unspecified site of breast ([place]) - Plan: PT plan of care cert/re-cert  Rationale for Evaluation and Treatment: Rehabilitation  ONSET DATE: 08/02/22  SUBJECTIVE:                                                                                                                                                                                           SUBJECTIVE STATEMENT: Patient reports she is here today to be seen by her medical team for her newly diagnosed left breast cancer.   PERTINENT HISTORY:  Patient was diagnosed on 08/02/22 with left grade 2. She underwent mammograms and ultrasound showing a 1.2 cm mass at the 2 o'clock position  of the left breast and deeper in the breast there were calcifications measuring 7 mm. Both areas were biopsied and both showed both invasive ductal carcinoma and ductal carcinoma in situ. The larger area showed 95% ER positivity, 50% PR positive, and a Ki-67 of 30%. It was HER2 negative. The other area showed 99% ER positive, 30% PR positive, 10% Ki-67, and HER2 negative as well.   PATIENT GOALS:   reduce lymphedema risk and learn post op HEP.   PAIN:  Are you  having pain? No  PRECAUTIONS: Active CA   HAND DOMINANCE: right  WEIGHT BEARING RESTRICTIONS: No  FALLS:  Has patient fallen in last 6 months? No  LIVING ENVIRONMENT: Patient lives with: husband Lives in: House/apartment Has following equipment at home: None  OCCUPATION: Full time, Forensic scientist  LEISURE: 4-5x/wk works out for an hour and a half, weight training, cycling, running  PRIOR LEVEL OF FUNCTION: Independent   OBJECTIVE:  COGNITION: Overall cognitive status: Within functional limits for tasks assessed    POSTURE:  Forward head and rounded shoulders posture  UPPER EXTREMITY AROM/PROM:  A/PROM RIGHT   eval   Shoulder extension 88  Shoulder flexion 180  Shoulder abduction 176  Shoulder internal rotation 58  Shoulder external rotation 110    (Blank rows = not tested)  A/PROM LEFT   eval  Shoulder extension 78  Shoulder flexion 179  Shoulder abduction 179  Shoulder internal rotation 74  Shoulder external rotation 115    (Blank rows = not tested)  CERVICAL AROM: All within normal limits:    Percent limited  Flexion WFL  Extension WFL  Right lateral flexion WFL  Left lateral flexion WFL  Right rotation WFL  Left rotation WFL    UPPER EXTREMITY STRENGTH: 5/5  LYMPHEDEMA ASSESSMENTS:   LANDMARK RIGHT   eval  10 cm proximal to olecranon process 27.5  Olecranon process 23.1  10 cm proximal to ulnar styloid process 18.7  Just proximal to ulnar styloid process 14.7  Across hand at  thumb web space 18  At base of 2nd digit 5.6  (Blank rows = not tested)  LANDMARK LEFT   eval  10 cm proximal to olecranon process 28  Olecranon process 23.4  10 cm proximal to ulnar styloid process 18.5  Just proximal to ulnar styloid process 14.8  Across hand at thumb web space 18.9  At base of 2nd digit 5.5  (Blank rows = not tested)  L-DEX LYMPHEDEMA SCREENING:  The patient was assessed using the L-Dex machine today to produce a lymphedema index baseline score. The patient will be reassessed on a regular basis (typically every 3 months) to obtain new L-Dex scores. If the score is > 6.5 points away from his/her baseline score indicating onset of subclinical lymphedema, it will be recommended to wear a compression garment for 4 weeks, 12 hours per day and then be reassessed. If the score continues to be > 6.5 points from baseline at reassessment, we will initiate lymphedema treatment. Assessing in this manner has a 95% rate of preventing clinically significant lymphedema.   L-DEX FLOWSHEETS - 08/14/22 1500       L-DEX LYMPHEDEMA SCREENING   Measurement Type Unilateral    L-DEX MEASUREMENT EXTREMITY Upper Extremity    POSITION  Standing    DOMINANT SIDE Right    At Risk Side Left    BASELINE SCORE (UNILATERAL) 1.6             QUICK DASH SURVEY:  Katina Dung - 08/14/22 0001     Open a tight or new jar No difficulty    Do heavy household chores (wash walls, wash floors) No difficulty    Carry a shopping bag or briefcase No difficulty    Wash your back No difficulty    Use a knife to cut food No difficulty    Recreational activities in which you take some force or impact through your arm, shoulder, or hand (golf, hammering, tennis) No difficulty    During the  past week, to what extent has your arm, shoulder or hand problem interfered with your normal social activities with family, friends, neighbors, or groups? Not at all    During the past week, to what extent has your arm,  shoulder or hand problem limited your work or other regular daily activities Not at all    Arm, shoulder, or hand pain. None    Tingling (pins and needles) in your arm, shoulder, or hand None    Difficulty Sleeping No difficulty    DASH Score 0 %              PATIENT EDUCATION:  Education details: Lymphedema risk reduction and post op shoulder/posture HEP Person educated: Patient Education method: Explanation, Demonstration, Handout Education comprehension: Patient verbalized understanding and returned demonstration  HOME EXERCISE PROGRAM: Patient was instructed today in a home exercise program today for post op shoulder range of motion. These included active assist shoulder flexion in sitting, scapular retraction, wall walking with shoulder abduction, and hands behind head external rotation.  She was encouraged to do these twice a day, holding 3 seconds and repeating 5 times when permitted by her physician.   ASSESSMENT:  CLINICAL IMPRESSION: Pt reports to PT with newly diagnosed L breast cancer. She will undergo a L breast lumpectomy and SLNB on 08/30/22.  Her ROM is WFL at baseline. She will benefit from a post op PT reassessment to determine needs and from L-Dex screens every 3 months for 2 years to detect subclinical lymphedema.  Pt will benefit from skilled therapeutic intervention to improve on the following deficits: Decreased knowledge of precautions, impaired UE functional use, pain, decreased ROM, postural dysfunction.   PT treatment/interventions: ADL/self-care home management, pt/family education, therapeutic exercise  REHAB POTENTIAL: Good  CLINICAL DECISION MAKING: Stable/uncomplicated  EVALUATION COMPLEXITY: Low   GOALS: Goals reviewed with patient? YES  LONG TERM GOALS: (STG=LTG)    Name Target Date Goal status  1 Pt will be able to verbalize understanding of pertinent lymphedema risk reduction practices relevant to her dx specifically related to skin care.   Baseline:  No knowledge 08/14/2022 Achieved at eval  2 Pt will be able to return demo and/or verbalize understanding of the post op HEP related to regaining shoulder ROM. Baseline:  No knowledge 08/14/2022 Achieved at eval  3 Pt will be able to verbalize understanding of the importance of attending the post op After Breast CA Class for further lymphedema risk reduction education and therapeutic exercise.  Baseline:  No knowledge 08/14/2022 Achieved at eval  4 Pt will demo she has regained full shoulder ROM and function post operatively compared to baselines.  Baseline: See objective measurements taken today. 09/25/22 NEW    PLAN:  PT FREQUENCY/DURATION: EVAL and 1 follow up appointment.   PLAN FOR NEXT SESSION: will reassess 3-4 weeks post op to determine needs.   Patient will follow up at outpatient cancer rehab 3-4 weeks following surgery.  If the patient requires physical therapy at that time, a specific plan will be dictated and sent to the referring physician for approval. The patient was educated today on appropriate basic range of motion exercises to begin post operatively and the importance of attending the After Breast Cancer class following surgery.  Patient was educated today on lymphedema risk reduction practices as it pertains to recommendations that will benefit the patient immediately following surgery.  She verbalized good understanding.    Physical Therapy Information for After Breast Cancer Surgery/Treatment:  Lymphedema is a swelling condition  that you may be at risk for in your arm if you have lymph nodes removed from the armpit area.  After a sentinel node biopsy, the risk is approximately 5-9% and is higher after an axillary node dissection.  There is treatment available for this condition and it is not life-threatening.  Contact your physician or physical therapist with concerns. You may begin the 4 shoulder/posture exercises (see additional sheet) when permitted by your  physician (typically a week after surgery).  If you have drains, you may need to wait until those are removed before beginning range of motion exercises.  A general recommendation is to not lift your arms above shoulder height until drains are removed.  These exercises should be done to your tolerance and gently.  This is not a "no pain/no gain" type of recovery so listen to your body and stretch into the range of motion that you can tolerate, stopping if you have pain.  If you are having immediate reconstruction, ask your plastic surgeon about doing exercises as he or she may want you to wait. We encourage you to attend the free one time ABC (After Breast Cancer) class offered by [facility].  You will learn information related to lymphedema risk, prevention and treatment and additional exercises to regain mobility following surgery.  You can call [phone] for more information.  This is offered the 1st and 3rd Monday of each month.  You only attend the class one time. While undergoing any medical procedure or treatment, try to avoid blood pressure being taken or needle sticks from occurring on the arm on the side of cancer.   This recommendation begins after surgery and continues for the rest of your life.  This may help reduce your risk of getting lymphedema (swelling in your arm). An excellent resource for those seeking information on lymphedema is the National Lymphedema Network's web site. It can be accessed at [place].org If you notice swelling in your hand, arm or breast at any time following surgery (even if it is many years from now), please contact your doctor or physical therapist to discuss this.  Lymphedema can be treated at any time but it is easier for you if it is treated early on.  If you feel like your shoulder motion is not returning to normal in a reasonable amount of time, please contact your surgeon or physical therapist.  [facility] [phone]. [address], [address], [place] [address]  ABC CLASS After Breast Cancer Class  After Breast Cancer Class is a specially designed exercise class to assist you in a safe recover after having breast cancer surgery.  In this class you will learn how to get back to full function whether your drains were just removed or if you had surgery a month ago.  This one-time class is held the 1st and 3rd Monday of every month from 11:00 a.m. until 12:00 noon virtually.  This class is FREE and space is limited. For more information or to register for the next available class, call [phone].  Class Goals  Understand specific stretches to improve the flexibility of you chest and shoulder. Learn ways to safely strengthen your upper body and improve your posture. Understand the warning signs of infection and why you may be at risk for an arm infection. Learn about Lymphedema and prevention.  ** You do not attend this class until after surgery.  Drains must be removed to participate  Patient  was instructed today in a home exercise program today for post op shoulder range of motion. These included active assist shoulder flexion in sitting, scapular retraction, wall walking with shoulder abduction, and hands behind head external rotation.  She was encouraged to do these twice a day, holding 3 seconds and repeating 5 times when permitted by her physician.    [id number] [place], PT 08/14/2022, 3:39 PM

## 2022-08-14 NOTE — Telephone Encounter (Signed)
Scheduled appt per 2/19 referral. Pt is aware of appt date and time. Pt is aware to arrive 15 mins prior to appt time and to bring and updated insurance card. Pt is aware of appt location.

## 2022-08-15 DIAGNOSIS — Z170 Estrogen receptor positive status [ER+]: Secondary | ICD-10-CM

## 2022-08-15 DIAGNOSIS — C50412 Malignant neoplasm of upper-outer quadrant of left female breast: Secondary | ICD-10-CM

## 2022-08-15 HISTORY — DX: Estrogen receptor positive status (ER+): C50.412

## 2022-08-15 HISTORY — DX: Estrogen receptor positive status (ER+): Z17.0

## 2022-08-15 NOTE — Progress Notes (Signed)
New Breast Cancer Diagnosis: Left Breast UOQ  Did patient present with symptoms (if so, please note symptoms) or screening mammography?:Palpable mass    Location and Extent of disease :left breast. Located at 2 o'clock position, measured 1.2 cm in greatest dimension. Deeper in the breast there were calcifications measuring 7 mm, Adenopathy no.  Histology per Pathology Report: grade 2, Invasive Ductal Carcinoma and DCIS. 08/02/2022  1) Receptor Status: ER(positive), PR (positive), Her2-neu (negative), Ki-(30%)  2)  Status: ER(positive), PR (positive), Her2-neu (negative), Ki-(10%)   Surgeon and surgical plan, if any:  Dr. Ninfa Linden -Left Breast Lumpectomy with radioactive seed x2 and SLN biopsy 08/30/2022   Medical oncologist, treatment if any:   Dr. Burr Medico 08/16/2022   Family History of Breast/Ovarian/Prostate Cancer: None  Lymphedema issues, if any: No     Pain issues, if any: None    SAFETY ISSUES: Prior radiation? No Pacemaker/ICD? No Possible current pregnancy? Having Cycles, No Birth control Is the patient on methotrexate? No  Current Complaints / other details:

## 2022-08-15 NOTE — Progress Notes (Unsigned)
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 female with   No problem-specific Assessment & Plan notes found for this encounter.  ***   PLAN:   SUMMARY OF ONCOLOGIC HISTORY: Oncology History  Primary malignant neoplasm of upper outer quadrant of left breast ([place])  08/15/2022 Initial Diagnosis   Primary malignant neoplasm of upper outer quadrant of left breast ([place])   08/15/2022 Cancer Staging   Staging form: Breast, AJCC 8th Edition - Clinical stage from 08/15/2022: Stage IA (cT1c, cN0, cM0, G2, ER+, PR+, HER2-) - Signed by Hayden Pedro, PA-C on 08/15/2022 Stage prefix: Initial diagnosis Method of lymph node assessment: Clinical Histologic grading system: 3 grade system      INTERVAL HISTORY: *** Tanya Rangel is here for a follow up of 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  MEDICAL HISTORY:  Past Medical History:  Diagnosis Date   Anemia    PE (pulmonary thromboembolism) ([place])     SURGICAL HISTORY: Past Surgical History:  Procedure Laterality Date   BREAST BIOPSY Left 08/02/2022   [place] LT BREAST BX W LOC DEV 1ST LESION IMG BX SPEC US GUIDE 08/02/2022 [facility]   BREAST BIOPSY Left 08/02/2022   MM LT BREAST BX W LOC DEV 1ST LESION IMAGE BX SPEC STEREO GUIDE 08/02/2022 [facility]    I have reviewed the social history and family history with the patient and they are unchanged from previous note.  ALLERGIES:  has No Known Allergies.  MEDICATIONS:  Current Outpatient Medications  Medication Sig Dispense Refill    ferrous sulfate 325 (65 FE) MG tablet TAKE 1 TABLET BY MOUTH EVERY DAY WITH BREAKFAST 30 tablet 3   Multiple Vitamins-Minerals (MULTIVITAMIN ADULT EXTRA C PO) Take 1 capsule by mouth daily.     rivaroxaban (XARELTO) 20 MG TABS tablet Take 20 mg by mouth daily with supper. (Patient not taking: Reported on 08/14/2022)     No current facility-administered medications for this visit.    PHYSICAL EXAMINATION: ECOG PERFORMANCE STATUS: {CHL ONC ECOG PS:[phone]}  There were no vitals filed for this visit. Wt Readings from Last 3 Encounters:  04/05/20 152 lb 6.4 oz (69.1 kg)  04/10/19 147 lb 6.4 oz (66.9 kg)  11/07/18 152 lb 11.2 oz (69.3 kg)    {Only keep what was examined. If exam not performed, can use .CEXAM } GENERAL:alert, no distress and comfortable SKIN: skin color, texture, turgor are normal, no rashes or significant lesions EYES: normal, Conjunctiva are pink and non-injected, sclera clear {OROPHARYNX:no exudate, no erythema and lips, buccal mucosa, and tongue normal}  NECK: supple, thyroid normal size, non-tender, without nodularity LYMPH:  no palpable lymphadenopathy in the cervical, axillary {or inguinal} LUNGS: clear to auscultation and percussion with normal breathing effort HEART: regular rate & rhythm and no murmurs and no lower extremity edema ABDOMEN:abdomen soft, non-tender and normal bowel sounds Musculoskeletal:no cyanosis of digits and no clubbing  NEURO: alert & oriented x 3 with fluent speech, no focal motor/sensory deficits  LABORATORY DATA:  I have reviewed the  data as listed    Latest Ref Rng & Units 04/09/2019   11:57 AM 11/07/2018   11:04 AM 10/10/2018    4:32 PM  CBC  WBC 4.0 - 10.5 K/uL 3.6  3.2  5.0   Hemoglobin 12.0 - 15.0 g/dL 11.6  9.8  11.7   Hematocrit 36.0 - 46.0 % 35.9  30.9  37.4   Platelets 150 - 400 K/uL 280  282  224         Latest Ref Rng & Units 04/09/2019   11:57 AM 11/07/2018   11:04 AM 10/10/2018    4:32 PM  CMP  Glucose 70 - 99  mg/dL 90  92  101   BUN 6 - 20 mg/dL 5  7  8   $ Creatinine 0.44 - 1.00 mg/dL 0.76  0.78  1.08   Sodium 135 - 145 mmol/L 140  138  139   Potassium 3.5 - 5.1 mmol/L 4.1  4.3  4.2   Chloride 98 - 111 mmol/L 105  107  107   CO2 22 - 32 mmol/L 27  26  20   $ Calcium 8.9 - 10.3 mg/dL 9.0  8.8  9.6   Total Protein 6.5 - 8.1 g/dL 7.0  7.1    Total Bilirubin 0.3 - 1.2 mg/dL 0.4  0.4    Alkaline Phos 38 - 126 U/L 59  64    AST 15 - 41 U/L 21  21    ALT 0 - 44 U/L 23  29        RADIOGRAPHIC STUDIES: I have personally reviewed the radiological images as listed and agreed with the findings in the report. No results found.    No orders of the defined types were placed in this encounter.  All questions were answered. The patient knows to call the clinic with any problems, questions or concerns. No barriers to learning was detected. The total time spent in the appointment was {CHL ONC TIME VISIT - [id number].     Baldemar Friday, [place] 08/15/2022   I, Audry Riles, CMA, am acting as scribe for Truitt Merle, MD.   {Add scribe attestation statement}

## 2022-08-15 NOTE — Progress Notes (Signed)
Radiation Oncology         [phone] ________________________________  Name: Tanya Rangel        MRN: [id number]  Date of Service: 08/16/2022 DOB: 02/23/1981  [id number], Margaretha Sheffield, MD  Coralie Keens, MD     REFERRING PHYSICIAN: Coralie Keens, MD   DIAGNOSIS: The encounter diagnosis was Primary malignant neoplasm of upper outer quadrant of left breast ([place]).   HISTORY OF PRESENT ILLNESS: Tanya Rangel is a 42 y.o. female seen at the request of Dr. Ninfa Linden for a new diagnosis of left breast cancer. The patient was noted to have a palpable mass. Diagnostic imaging  on 07/25/22 showed a mass and calcifications in the upper outer left breast. By ultrasound the left breast mass measured 1.2 cm in the 2:00 position. Her axilla was negative for adenopathy. Biopsies on 08/02/22 were performed by ultrasound for the mass and stereotactically for the calcifications. The specimen in the 2:00 position showed a grade 2, invasive ductal carcinoma with associated intermediate grade DCIS. The UOQ specimen showed a grade 2 invasive ductal carcinoma with focal intermediate grade DCIS with microcalcifications. The invasive component was ER/PR positive, HER2 negative with a Ki 67 of 30%, and the DCIS was ER/PR positive. She's planning on lumpectomy and sentinel node biopsy on 08/30/22 with Dr. Ninfa Linden and is seen to discuss adjuvant therapy.  PREVIOUS RADIATION THERAPY: {EXAM; YES/NO:19492::"No"}   PAST MEDICAL HISTORY:  Past Medical History:  Diagnosis Date   Anemia    PE (pulmonary thromboembolism) ([place])        PAST SURGICAL HISTORY: Past Surgical History:  Procedure Laterality Date   BREAST BIOPSY Left 08/02/2022   [place] LT BREAST BX W LOC DEV 1ST LESION IMG BX SPEC US GUIDE 08/02/2022 [facility]   BREAST BIOPSY Left 08/02/2022   MM LT BREAST BX W LOC DEV 1ST LESION IMAGE BX SPEC STEREO GUIDE 08/02/2022 [facility]     FAMILY HISTORY:  Family History  Problem Relation Age of Onset    Thrombosis Neg Hx      SOCIAL HISTORY:  reports that she has never smoked. She has never used smokeless tobacco. She reports that she does not drink alcohol and does not use drugs. The patient is married and lives in [place]. 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   ALLERGIES: Patient has no known allergies.   MEDICATIONS:  Current Outpatient Medications  Medication Sig Dispense Refill   ferrous sulfate 325 (65 FE) MG tablet TAKE 1 TABLET BY MOUTH EVERY DAY WITH BREAKFAST 30 tablet 3   Multiple Vitamins-Minerals (MULTIVITAMIN ADULT EXTRA C PO) Take 1 capsule by mouth daily.     rivaroxaban (XARELTO) 20 MG TABS tablet Take 20 mg by mouth daily with supper. (Patient not taking: Reported on 08/14/2022)     No current facility-administered medications for this visit.     REVIEW OF SYSTEMS: On review of systems, the patient reports that she is doing ***     PHYSICAL EXAM:  Wt Readings from Last 3 Encounters:  04/05/20 152 lb 6.4 oz (69.1 kg)  04/10/19 147 lb 6.4 oz (66.9 kg)  11/07/18 152 lb 11.2 oz (69.3 kg)   Temp Readings from Last 3 Encounters:  04/05/20 97.8 F (36.6 C)  04/10/19 98 F (36.7 C) (Temporal)  11/07/18 99.8 F (37.7 C) (Oral)   BP Readings from Last 3 Encounters:  04/05/20 112/60  04/10/19 108/69  11/07/18 106/62   Pulse Readings  from Last 3 Encounters:  04/05/20 62  04/10/19 77  11/07/18 62    In general this is a well appearing African female in no acute distress. She's alert and oriented x4 and appropriate throughout the examination. Cardiopulmonary assessment is negative for acute distress and she exhibits normal effort. Bilateral breast exam is deferred.    ECOG = ***  0 - Asymptomatic (Fully active, able to carry on all predisease activities without restriction)  1 - Symptomatic but completely ambulatory (Restricted in physically strenuous activity but ambulatory and able to carry out  work of a light or sedentary nature. For example, light housework, office work)  2 - Symptomatic, <50% in bed during the day (Ambulatory and capable of all self care but unable to carry out any work activities. Up and about more than 50% of waking hours)  3 - Symptomatic, >50% in bed, but not bedbound (Capable of only limited self-care, confined to bed or chair 50% or more of waking hours)  4 - Bedbound (Completely disabled. Cannot carry on any self-care. Totally confined to bed or chair)  5 - Death   Eustace Pen MM, Creech RH, Tormey DC, et al. [id number]). "Toxicity and response criteria of the [facility]". [place] Oncol. 5 (6): 649-55    LABORATORY DATA:  Lab Results  Component Value Date   WBC 3.6 (L) 04/09/2019   HGB 11.6 (L) 04/09/2019   HCT 35.9 (L) 04/09/2019   MCV 87.8 04/09/2019   PLT 280 04/09/2019   Lab Results  Component Value Date   NA 140 04/09/2019   K 4.1 04/09/2019   CL 105 04/09/2019   CO2 27 04/09/2019   Lab Results  Component Value Date   ALT 23 04/09/2019   AST 21 04/09/2019   ALKPHOS 59 04/09/2019   BILITOT 0.4 04/09/2019      RADIOGRAPHY: MM LT BREAST BX W LOC DEV 1ST LESION IMAGE BX SPEC STEREO GUIDE  Addendum Date: 08/08/2022   ADDENDUM REPORT: 08/08/2022 07:45 ADDENDUM: Pathology revealed GRADE II INVASIVE MODERATELY DIFFERENTIATED DUCTAL ADENOCARCINOMA, FOCAL DUCTAL CARCINOMA IN SITU, INTERMEDIATE NUCLEAR GRADE, SOLID TYPE WITHOUT NECROSIS of the LEFT breast, 2 o'clock, 5cmfn (ribbon). This was found to be concordant by Dr. Everlean Alstrom. Pathology revealed GRADE II INVASIVE MODERATELY DIFFERENTIATED DUCTAL ADENOCARCINOMA, FOCAL DUCTAL CARCINOMA IN SITU, INTERMEDIATE NUCLEAR GRADE, MICROCALCIFICATIONS PRESENT WITHIN PERITUMORAL STROMA, DCIS AND COLUMNAR CELL CHANGE, BACKGROUND FIBROCYSTIC CHANGES INCLUDING STROMAL FIBROSIS, CYSTIC DILATATION OF DUCTS, APOCRINE METAPLASIA AND COLUMNAR CELL CHANGE of the LEFT breast, upper outer  quadrant, (coil clip). This was found to be concordant by Dr. Everlean Alstrom. Pathology results were discussed with the patient by telephone. The patient reported doing well after the biopsies with tenderness at the sites. Post biopsy instructions and care were reviewed and questions were answered. The patient was encouraged to call [facility] for any additional concerns. My direct phone number was provided. Surgical consultation has been arranged with Dr. Nedra Hai at [facility] on August 13, 2022. Medical oncology consultation was previously arranged with Dr. Truitt Merle at [facility] on August 16, 2022. NOTE: A coil shaped biopsy marking clip is present and positioned 2 cm medial to the biopsied calcifications in the upper-outer LEFT breast. Residual calcifications are present at the biopsy site for localization purposes if necessary. Pathology results reported by Terie Purser, RN on 08/06/2022. Electronically Signed   By: Everlean Alstrom M.D.   On: 08/08/2022 07:45  Result Date: 08/08/2022 CLINICAL DATA:  Patient presents for stereotactic guided biopsy of a 0.7 cm group of calcifications in the upper-outer left breast. EXAM: LEFT BREAST STEREOTACTIC CORE NEEDLE BIOPSY COMPARISON:  Previous exam(s). FINDINGS: The patient and I discussed the procedure of stereotactic-guided biopsy including benefits and alternatives. We discussed the high likelihood of a successful procedure. We discussed the risks of the procedure including infection, bleeding, tissue injury, clip migration, and inadequate sampling. Informed written consent was given. The usual time out protocol was performed immediately prior to the procedure. Using sterile technique and 1% Lidocaine as local anesthetic, under stereotactic guidance, a 9 gauge vacuum assisted device was used to perform core needle biopsy of the calcifications in the upper-outer left breast using a lateral to  medial approach. Specimen radiograph was performed showing the presence of calcifications. Specimens with calcifications are identified for pathology. Lesion quadrant: Upper-outer At the conclusion of the procedure, a coil shaped tissue marker clip was deployed into the biopsy cavity. Follow-up 2-view mammogram was performed and dictated separately. IMPRESSION: Stereotactic-guided biopsy of calcifications in the upper-outer left breast. No apparent complications. Electronically Signed: By: Everlean Alstrom M.D. On: 08/02/2022 09:07  [place] LT BREAST BX W LOC DEV 1ST LESION IMG BX SPEC US GUIDE  Addendum Date: 08/08/2022   ADDENDUM REPORT: 08/08/2022 07:45 ADDENDUM: Pathology revealed GRADE II INVASIVE MODERATELY DIFFERENTIATED DUCTAL ADENOCARCINOMA, FOCAL DUCTAL CARCINOMA IN SITU, INTERMEDIATE NUCLEAR GRADE, SOLID TYPE WITHOUT NECROSIS of the LEFT breast, 2 o'clock, 5cmfn (ribbon). This was found to be concordant by Dr. Everlean Alstrom. Pathology revealed GRADE II INVASIVE MODERATELY DIFFERENTIATED DUCTAL ADENOCARCINOMA, FOCAL DUCTAL CARCINOMA IN SITU, INTERMEDIATE NUCLEAR GRADE, MICROCALCIFICATIONS PRESENT WITHIN PERITUMORAL STROMA, DCIS AND COLUMNAR CELL CHANGE, BACKGROUND FIBROCYSTIC CHANGES INCLUDING STROMAL FIBROSIS, CYSTIC DILATATION OF DUCTS, APOCRINE METAPLASIA AND COLUMNAR CELL CHANGE of the LEFT breast, upper outer quadrant, (coil clip). This was found to be concordant by Dr. Everlean Alstrom. Pathology results were discussed with the patient by telephone. The patient reported doing well after the biopsies with tenderness at the sites. Post biopsy instructions and care were reviewed and questions were answered. The patient was encouraged to call [facility] for any additional concerns. My direct phone number was provided. Surgical consultation has been arranged with Dr. Nedra Hai at [facility] on August 13, 2022. Medical oncology consultation was previously  arranged with Dr. Truitt Merle at [facility] on August 16, 2022. NOTE: A coil shaped biopsy marking clip is present and positioned 2 cm medial to the biopsied calcifications in the upper-outer LEFT breast. Residual calcifications are present at the biopsy site for localization purposes if necessary. Pathology results reported by Terie Purser, RN on 08/06/2022. Electronically Signed   By: Everlean Alstrom M.D.   On: 08/08/2022 07:45   Result Date: 08/08/2022 CLINICAL DATA:  Patient presents for ultrasound-guided core biopsy a 1.2 cm suspicious palpable mass in the left breast at 2 o'clock 5 cm from nipple. EXAM: ULTRASOUND GUIDED LEFT BREAST CORE NEEDLE BIOPSY COMPARISON:  Previous exam(s). PROCEDURE: I met with the patient and we discussed the procedure of ultrasound-guided biopsy, including benefits and alternatives. We discussed the high likelihood of a successful procedure. We discussed the risks of the procedure, including infection, bleeding, tissue injury, clip migration, and inadequate sampling. Informed written consent was given. The usual time-out protocol was performed immediately prior to the procedure. Lesion quadrant: Upper-outer Using sterile technique and 1% Lidocaine as local anesthetic, under direct ultrasound visualization, a 14  gauge spring-loaded device was used to perform biopsy of the mass in the left breast at the 2 o'clock position using a lateral to medial approach. At the conclusion of the procedure a ribbon shaped tissue marker clip was deployed into the biopsy cavity. Follow up 2 view mammogram was performed and dictated separately. IMPRESSION: Ultrasound guided biopsy of the mass in the left breast at the 2 o'clock position. No apparent complications. Electronically Signed: By: Everlean Alstrom M.D. On: 08/02/2022 09:06  MM CLIP PLACEMENT LEFT  Addendum Date: 08/02/2022   ADDENDUM REPORT: 08/02/2022 10:13 ADDENDUM: An addendum is made to this report due to an omission.  The corrected report should read as below: CLINICAL DATA: Post stereotactic guided biopsy of calcifications in the upper-outer left breast and ultrasound-guided core biopsy of a mass in the left breast at 2 o'clock 5 cm from nipple. EXAM: 3D DIAGNOSTIC LEFT MAMMOGRAM POST ULTRASOUND AND STEREOTACTIC BIOPSIES COMPARISON: Previous exam(s). FINDINGS: 3D Mammographic images were obtained following stereotactic guided biopsy of a 0.7 cm group of calcifications in the upper-outer left Breast as well as ultrasound-guided core biopsy of a mass in the left breast at the 2 o'clock position. A coil shaped biopsy marking clip is present and positioned 2 cm medial to the biopsied calcifications in the upper-outer left breast. Residual calcifications are present at the biopsy site for localization purposes if necessary. A ribbon shaped biopsy marking clip is present at the site of the biopsied mass in the left breast at the 2 o'clock position. IMPRESSION: 1. Coil shaped biopsy marking clip is positioned 2 cm medial to the biopsied calcifications in the upper-outer left breast. 2. Ribbon shaped biopsy marking clip at site of biopsied mass in the left breast at the 2 o'clock position. Final Assessment: Post Procedure Mammograms for Marker Placement Electronically Signed   By: Everlean Alstrom M.D.   On: 08/02/2022 10:13   Result Date: 08/02/2022 CLINICAL DATA:  Post stereotactic guided biopsy of calcifications in the upper-outer left breast. EXAM: 3D DIAGNOSTIC LEFT MAMMOGRAM POST ULTRASOUND BIOPSY COMPARISON:  Previous exam(s). FINDINGS: 3D Mammographic images were obtained following stereotactic guided biopsy of a 0.7 cm group of calcifications in the upper-outer left breast. The biopsy marking clip is in expected position at the site of biopsy. A coil shaped biopsy marking clip is present and positioned 2 cm medial to the biopsied calcifications in the upper-outer left breast. Residual calcifications are present at the biopsy  site for localization purposes if necessary. IMPRESSION: Coil shaped biopsy marking clip is positioned 2 cm medial to the biopsied calcifications in the upper-outer left breast. Final Assessment: Post Procedure Mammograms for Marker Placement Electronically Signed: By: Everlean Alstrom M.D. On: 08/02/2022 09:14   US PELVIC COMPLETE WITH TRANSVAGINAL  Result Date: 07/31/2022 CLINICAL DATA:  Menorrhagia. The patient's last menstrual period was 07/11/2022. EXAM: TRANSABDOMINAL AND TRANSVAGINAL ULTRASOUND OF PELVIS TECHNIQUE: Both transabdominal and transvaginal ultrasound examinations of the pelvis were performed. Transabdominal technique was performed for global imaging of the pelvis including uterus, ovaries, adnexal regions, and pelvic cul-de-sac. It was necessary to proceed with endovaginal exam following the transabdominal exam to visualize the endometrium and ovaries. COMPARISON:  Hysterosalpingogram dated 04/18/2012 and ultrasound dated 11/13/2007. FINDINGS: Uterus Measurements: 3.8 x 8.7 x 9.2 cm = volume: 575 mL. Multiple uterine masses are seen, likely reflecting fibroadenomas. These measure 6.1 x 5.3 x 6.0 cm in the uterine body, 5.5 x 3.7 x 4.1 cm in the anterior aspect of the uterine body, 2.0 x 1.7  x 2.2 cm in the uterine fundus, and 3.5 x 3.8 x 3.3 cm in the anterior aspect of the uterine fundus. Endometrium Thickness: 6 mm.  No focal abnormality visualized. Right ovary Not visualized. Left ovary Measurements: 2.7 x 1.7 x 1.2 cm = volume: 3 mL. Normal appearance/no adnexal mass. Other findings No abnormal free fluid. IMPRESSION: 1. Multiple uterine masses are most consistent with fibroadenomas. The largest measures up to 6.1 cm in diameter. 2. Normal thickness of the endometrium. If bleeding remains unresponsive to hormonal or medical therapy, sonohysterogram should be considered for focal lesion work-up. (Ref: Radiological Reasoning: Algorithmic Workup of Abnormal Vaginal Bleeding with Endovaginal  Sonography and Sonohysterography. AJR [id number]) Electronically Signed   By: Zerita Boers M.D.   On: 07/31/2022 15:11   MM DIAG BREAST TOMO BILATERAL  Result Date: 07/25/2022 CLINICAL DATA:  Patient presents with a palpable left breast lump. She also returns for follow-up left breast calcifications, initially noted on a screening exam dated 04/17/2021 assessed with diagnostic imaging on 07/25/2022 and 07/10/2021. EXAM: DIGITAL DIAGNOSTIC BILATERAL MAMMOGRAM WITH TOMOSYNTHESIS; ULTRASOUND LEFT BREAST LIMITED TECHNIQUE: Bilateral digital diagnostic mammography and breast tomosynthesis was performed.; Targeted ultrasound examination of the left breast was performed. COMPARISON:  Previous exam(s). ACR Breast Density Category c: The breast tissue is heterogeneously dense, which may obscure small masses. FINDINGS: There is an ill-defined mass the upper left breast, proximally 1 cm size, best seen on the spot tangential view, corresponding to the palpable abnormality. Small group of calcifications are noted in the lateral left breast, posterior depth, approximately 2.5 cm posterior, inferior and medial to the palpable mass. Calcifications span a maximum of 7 mm, unchanged in extent, configuration and overall number from the prior studies. There is no associated mass or distortion and no linearity or branching. On physical exam, there is a small firm mobile mass the superficial tissues of the upper outer left breast. Targeted ultrasound is performed, showing a hypoechoic irregular mass, taller than wide, with a rim of increased echogenicity. Mass measures 1.2 x 0.7 x 0.7 cm, corresponding to the palpable abnormality. No other masses noted in the upper quadrant. Normal lymph nodes noted in the left axilla. No enlarged or abnormal lymph nodes. IMPRESSION: 1. Irregular 1.2 cm mass in left breast at 2 o'clock, 5 cm the nipple, corresponding to the palpable lump, highly suspicious breast malignancy. 2. Small group  calcifications in the posterior, lateral left breast, without change compared to prior exams dating back to 04/17/2021. Given the stability, these are likely to be benign calcifications, but have some increased suspicion due to adjacent palpable. RECOMMENDATION: 1. Ultrasound-guided core needle biopsy of the left breast mass. 2. Stereotactic core needle biopsy of the left breast calcifications given likelihood that the adjacent palpable mass is a breast carcinoma. I have discussed the findings and recommendations with the patient. If applicable, a reminder letter will be sent to the patient regarding the next appointment. BI-RADS CATEGORY  5: Highly suggestive of malignancy. Electronically Signed   By: Lajean Manes M.D.   On: 07/25/2022 09:49  US BREAST LTD UNI LEFT INC AXILLA  Result Date: 07/25/2022 CLINICAL DATA:  Patient presents with a palpable left breast lump. She also returns for follow-up left breast calcifications, initially noted on a screening exam dated 04/17/2021 assessed with diagnostic imaging on 07/25/2022 and 07/10/2021. EXAM: DIGITAL DIAGNOSTIC BILATERAL MAMMOGRAM WITH TOMOSYNTHESIS; ULTRASOUND LEFT BREAST LIMITED TECHNIQUE: Bilateral digital diagnostic mammography and breast tomosynthesis was performed.; Targeted ultrasound examination of the left breast was  performed. COMPARISON:  Previous exam(s). ACR Breast Density Category c: The breast tissue is heterogeneously dense, which may obscure small masses. FINDINGS: There is an ill-defined mass the upper left breast, proximally 1 cm size, best seen on the spot tangential view, corresponding to the palpable abnormality. Small group of calcifications are noted in the lateral left breast, posterior depth, approximately 2.5 cm posterior, inferior and medial to the palpable mass. Calcifications span a maximum of 7 mm, unchanged in extent, configuration and overall number from the prior studies. There is no associated mass or distortion and no  linearity or branching. On physical exam, there is a small firm mobile mass the superficial tissues of the upper outer left breast. Targeted ultrasound is performed, showing a hypoechoic irregular mass, taller than wide, with a rim of increased echogenicity. Mass measures 1.2 x 0.7 x 0.7 cm, corresponding to the palpable abnormality. No other masses noted in the upper quadrant. Normal lymph nodes noted in the left axilla. No enlarged or abnormal lymph nodes. IMPRESSION: 1. Irregular 1.2 cm mass in left breast at 2 o'clock, 5 cm the nipple, corresponding to the palpable lump, highly suspicious breast malignancy. 2. Small group calcifications in the posterior, lateral left breast, without change compared to prior exams dating back to 04/17/2021. Given the stability, these are likely to be benign calcifications, but have some increased suspicion due to adjacent palpable. RECOMMENDATION: 1. Ultrasound-guided core needle biopsy of the left breast mass. 2. Stereotactic core needle biopsy of the left breast calcifications given likelihood that the adjacent palpable mass is a breast carcinoma. I have discussed the findings and recommendations with the patient. If applicable, a reminder letter will be sent to the patient regarding the next appointment. BI-RADS CATEGORY  5: Highly suggestive of malignancy. Electronically Signed   By: Lajean Manes M.D.   On: 07/25/2022 09:49      IMPRESSION/PLAN: 1. Stage IA, [id number], grade 2, ER/PR positive invasive ductal carcinoma of the left breast. Dr. Lisbeth Renshaw discusses the pathology findings and reviews the nature of early stage left breast disease. She will proceed with breast conservation with left lumpectomy with sentinel node biopsy. Depending on the size of the final tumor measurements rendered by pathology, the tumor may be tested for Oncotype Dx score to determine a role for systemic therapy. Provided that chemotherapy is not indicated, the patient's course would then be  followed by external radiotherapy to the breast  to reduce risks of local recurrence followed by antiestrogen therapy. We discussed the risks, benefits, short, and long term effects of radiotherapy, as well as the curative intent, and the patient is interested in proceeding. Dr. Lisbeth Renshaw discusses the delivery and logistics of radiotherapy and anticipates a course of 6 1/2 weeks of radiotherapy, though she is aware that insurance may only cover hypofractionated treatment. We will see her back a few weeks after surgery to discuss the simulation process and anticipate we starting radiotherapy about 4-6 weeks after surgery.  2. Possible genetic predisposition to malignancy. The patient is a candidate for genetic testing given *** personal and family history. She will meet with our geneticist today in clinic. 3. Contraceptive Counseling. ***   In a visit lasting *** minutes, greater than 50% of the time was spent face to face reviewing her case, as well as in preparation of, discussing, and coordinating the patient's care.  The above documentation reflects my direct findings during this shared patient visit. Please see the separate note by Dr. Lisbeth Renshaw on this date for the  remainder of the patient's plan of care.    Carola Rhine, [facility]    **Disclaimer: This note was dictated with voice recognition software. Similar sounding words can inadvertently be transcribed and this note may contain transcription errors which may not have been corrected upon publication of note.**

## 2022-08-16 ENCOUNTER — Other Ambulatory Visit: Payer: Self-pay | Admitting: Genetic Counselor

## 2022-08-16 ENCOUNTER — Ambulatory Visit
Admission: RE | Admit: 2022-08-16 | Discharge: 2022-08-16 | Disposition: A | Discharge status: Home / Self Care | Payer: BC Managed Care – PPO | Source: Ambulatory Visit | Attending: Radiation Oncology | Admitting: Radiation Oncology

## 2022-08-16 ENCOUNTER — Inpatient Hospital Stay (HOSPITAL_BASED_OUTPATIENT_CLINIC_OR_DEPARTMENT_OTHER): Payer: BC Managed Care – PPO | Admitting: Genetic Counselor

## 2022-08-16 ENCOUNTER — Inpatient Hospital Stay: Payer: BC Managed Care – PPO

## 2022-08-16 ENCOUNTER — Inpatient Hospital Stay: Payer: BC Managed Care – PPO | Attending: Hematology | Admitting: Nurse Practitioner

## 2022-08-16 ENCOUNTER — Encounter: Payer: Self-pay | Admitting: Radiation Oncology

## 2022-08-16 ENCOUNTER — Encounter: Payer: Self-pay | Admitting: Nurse Practitioner

## 2022-08-16 VITALS — BP 109/70 | HR 59 | Temp 96.9°F | Resp 18 | Ht 65.0 in | Wt 143.1 lb

## 2022-08-16 DIAGNOSIS — C50412 Malignant neoplasm of upper-outer quadrant of left female breast: Secondary | ICD-10-CM | POA: Insufficient documentation

## 2022-08-16 DIAGNOSIS — D649 Anemia, unspecified: Secondary | ICD-10-CM | POA: Insufficient documentation

## 2022-08-16 DIAGNOSIS — Z7901 Long term (current) use of anticoagulants: Secondary | ICD-10-CM | POA: Diagnosis not present

## 2022-08-16 DIAGNOSIS — Z86718 Personal history of other venous thrombosis and embolism: Secondary | ICD-10-CM | POA: Diagnosis not present

## 2022-08-16 DIAGNOSIS — Z1379 Encounter for other screening for genetic and chromosomal anomalies: Secondary | ICD-10-CM

## 2022-08-16 DIAGNOSIS — D500 Iron deficiency anemia secondary to blood loss (chronic): Secondary | ICD-10-CM

## 2022-08-16 DIAGNOSIS — Z86711 Personal history of pulmonary embolism: Secondary | ICD-10-CM | POA: Insufficient documentation

## 2022-08-16 DIAGNOSIS — Z79818 Long term (current) use of other agents affecting estrogen receptors and estrogen levels: Secondary | ICD-10-CM | POA: Diagnosis not present

## 2022-08-16 DIAGNOSIS — N920 Excessive and frequent menstruation with regular cycle: Secondary | ICD-10-CM | POA: Diagnosis not present

## 2022-08-16 DIAGNOSIS — Z170 Estrogen receptor positive status [ER+]: Secondary | ICD-10-CM | POA: Insufficient documentation

## 2022-08-16 DIAGNOSIS — K5900 Constipation, unspecified: Secondary | ICD-10-CM | POA: Diagnosis not present

## 2022-08-16 LAB — FERRITIN: Ferritin: 28 ng/mL (ref 11–307)

## 2022-08-16 LAB — VITAMIN D 25 HYDROXY (VIT D DEFICIENCY, FRACTURES): Vit D, 25-Hydroxy: 99.35 ng/mL (ref 30–100)

## 2022-08-16 LAB — CBC WITH DIFFERENTIAL (CANCER CENTER ONLY)
Abs Immature Granulocytes: 0.01 10*3/uL (ref 0.00–0.07)
Basophils Absolute: 0 10*3/uL (ref 0.0–0.1)
Basophils Relative: 1 %
Eosinophils Absolute: 0.5 10*3/uL (ref 0.0–0.5)
Eosinophils Relative: 13 %
HCT: 36 % (ref 36.0–46.0)
Hemoglobin: 11.5 g/dL — ABNORMAL LOW (ref 12.0–15.0)
Immature Granulocytes: 0 %
Lymphocytes Relative: 47 %
Lymphs Abs: 1.7 10*3/uL (ref 0.7–4.0)
MCH: 25.8 pg — ABNORMAL LOW (ref 26.0–34.0)
MCHC: 31.9 g/dL (ref 30.0–36.0)
MCV: 80.7 fL (ref 80.0–100.0)
Monocytes Absolute: 0.3 10*3/uL (ref 0.1–1.0)
Monocytes Relative: 9 %
Neutro Abs: 1.1 10*3/uL — ABNORMAL LOW (ref 1.7–7.7)
Neutrophils Relative %: 30 %
Platelet Count: 352 10*3/uL (ref 150–400)
RBC: 4.46 MIL/uL (ref 3.87–5.11)
RDW: 23.4 % — ABNORMAL HIGH (ref 11.5–15.5)
WBC Count: 3.6 10*3/uL — ABNORMAL LOW (ref 4.0–10.5)
nRBC: 0 % (ref 0.0–0.2)

## 2022-08-16 LAB — CMP (CANCER CENTER ONLY)
ALT: 20 U/L (ref 0–44)
AST: 25 U/L (ref 15–41)
Albumin: 4.4 g/dL (ref 3.5–5.0)
Alkaline Phosphatase: 54 U/L (ref 38–126)
Anion gap: 9 (ref 5–15)
BUN: 5 mg/dL — ABNORMAL LOW (ref 6–20)
CO2: 27 mmol/L (ref 22–32)
Calcium: 9.5 mg/dL (ref 8.9–10.3)
Chloride: 103 mmol/L (ref 98–111)
Creatinine: 0.6 mg/dL (ref 0.44–1.00)
GFR, Estimated: >60 mL/min (ref >60)
Glucose, Bld: 74 mg/dL (ref 70–99)
Potassium: 4.3 mmol/L (ref 3.5–5.1)
Sodium: 139 mmol/L (ref 135–145)
Total Bilirubin: 0.4 mg/dL (ref 0.3–1.2)
Total Protein: 7.3 g/dL (ref 6.5–8.1)

## 2022-08-16 LAB — IRON AND IRON BINDING CAPACITY (CC-WL,HP ONLY)
Iron: 162 ug/dL (ref 28–170)
Saturation Ratios: 41 % — ABNORMAL HIGH (ref 10.4–31.8)
TIBC: 392 ug/dL (ref 250–450)
UIBC: 230 ug/dL (ref 148–442)

## 2022-08-16 LAB — GENETIC SCREENING ORDER

## 2022-08-16 NOTE — Progress Notes (Addendum)
Patient Care Team: Kelton Pillar, MD as PCP - General (Family Medicine) Alla Feeling, NP as Nurse Practitioner (Hematology and Oncology)   CHIEF COMPLAINT: New breast cancer diagnosis, history of IDA and unprovoked PE  Oncology History  Primary malignant neoplasm of upper outer quadrant of left breast ([place])  07/25/2022 Imaging   Mammo/US IMPRESSION: 1. Irregular 1.2 cm mass in left breast at 2 o'clock, 5 cm the nipple, corresponding to the palpable lump, highly suspicious breast malignancy. 2. Small group calcifications in the posterior, lateral left breast, without change compared to prior exams dating back to 0/24/2022. Given the stability, these are likely to be benign calcifications, but have some increased suspicion due to adjacent palpable.   08/02/2022 Initial Biopsy   1. Breast, left, needle core biopsy, 2 o'clock, 5cmfn (ribbon) INVASIVE MODERATELY DIFFERENTIATED DUCTAL ADENOCARCINOMA, GRADE 2 (3+2+1) FOCAL DUCTAL CARCINOMA IN SITU, INTERMEDIATE NUCLEAR GRADE, SOLID TYPE WITHOUT NECROSIS TUBULE FORMATION: SCORE 3 NUCLEAR PLEOMORPHISM: SCORE 2 MITOTIC COUNT: SCORE 1 TOTAL SCORE: 6 OF 9 OVERALL GRADE: GRADE 2 NEGATIVE FOR LYMPHOVASCULAR INVASION NEGATIVE FOR MICROCALCIFICATIONS INVASIVE TUMOR MEASURES 6 MM IN GREATEST LINEAR EXTENT  1. Breast, left, needle core biopsy, 2 o'clock, 5 cmfn (ribbon) PROGNOSTIC INDICATORS  The tumor cells are equivocal for Her2 (2+). Her2 by FISH will be performed and the results reported separately. Estrogen Receptor: 95%, POSITIVE, MODERATE-STRONG STAINING INTENSITY Progesterone Receptor: 50%, POSITIVE, MODERATE-STRONG STAINING INTENSITY Proliferation Marker Ki67: 30%  1. Breast,left, needle core biopsy,2.o'clock,5 cmfn (ribbon) FLUORESCENCE IN-SITU HYBRIDIZATION Results: GROUP 5: HER2 **NEGATIVE**  2. Breast, left, needle core biopsy, UOQ (coil) INVASIVE MODERATELY DIFFERENTIATED DUCTAL ADENOCARCINOMA, GRADE 2 (3+2+1) FOCAL  DUCTAL CARCINOMA IN SITU, INTERMEDIATE NUCLEAR GRADE NECROSIS TUBULE FORMATION: SCORE 3 NUCLEAR PLEOMORPHISM: SCORE 2 MITOTIC COUNT: SCORE 1 TOTAL SCORE: 6 OF 9 OVERALL GRADE: GRADE 2 MICROCALCIFICATIONS PRESENT WITHIN PERITUMORAL STROMA, DCIS AND COLUMNAR CELL CHANGE NEGATIVE FOR ANGIOLYMPHATIC INVASION BACKGROUND FIBROCYSTIC CHANGES INCLUDING STROMAL FIBROSIS, CYSTIC DILATATION OF DUCTS, APOCRINE METAPLASIA AND COLUMNAR CELL CHANGE TUMOR MEASURES 1.25 MM IN GREATEST LINEAR EXTENT  2. Breast, left, needle core biopsy, UOQ (coil) PROGNOSTIC INDICATORS  The tumor cells are equivocal for Her2 (2+). Her2 by FISH will be performed and the results reported separately. Estrogen Receptor: 99%, POSITIVE, STRONG STAINING INTENSITY Progesterone Receptor: 30%, POSITIVE, WEAK-MODERATE STAINING INTENSITY Proliferation Marker Ki67: 10%  2. Breast, left, needle core biopsy, UOQ(coil) FLUORESCENCE IN-SITU HYBRIDIZATION Results: GROUP 5: HER2 **NEGATIVE**     08/15/2022 Initial Diagnosis   Primary malignant neoplasm of upper outer quadrant of left breast ([place])   08/15/2022 Cancer Staging   Staging form: Breast, AJCC 8th Edition - Clinical stage from 08/15/2022: Stage IA (cT1c, cN0, cM0, G2, ER+, PR+, HER2-) - Signed by Hayden Pedro, PA-C on 08/15/2022 Stage prefix: Initial diagnosis Method of lymph node assessment: Clinical Histologic grading system: 3 grade system      INTERVAL HISTORY Ms. Urenda presents for follow-up, previously seen in our clinic 03/2020 for IDA secondary to menorrhagia and first episode of unprovoked PE (10/10/2018).  She  palpated a lump in the left breast, diagnostic mammogram 07/25/2022 showed an ill-defined mass in the upper left breast 1 cm in size and stable small group of calcifications in the lateral left breast spanning a maximum of 7 mm.  Ultrasound showed a hypoechoic irregular mass measuring 1.2 x 0.7 x 0.7 cm at 2:00, 5 cmfn, corresponding to the  palpable mass, and normal-appearing lymph nodes.  Biopsy of the 2:00 mass showed grade 2 invasive ductal  carcinoma with associated intermediate grade DCIS. Prognostic panel shows ER 95% positive with moderate to strong staining, PR 50% positive with moderate to strong staining, Ki-67 of 30%, HER2 equivocal (2+) but negative by FISH. The additional left lateral breast biopsy showed grade 2 invasive ductal adenocarcinoma with associated grade 2 focal DCIS. Prognostic panel on this biopsy showed ER 99% positive, strong, PR 30% positive, weak to moderate staining, and Ki-67 of 10%, HER2 equivocal (2+) and negative by FISH.  She was seen by Dr. Ninfa Linden 08/13/2022 with a plan for breast conserving surgery to include left breast radioactive seed guided lumpectomy with sentinel lymph node biopsy which is scheduled on 08/30/2022. She met Dr. Lisbeth Renshaw today to discuss adjuvant radiation, and had genetic testing done as well.   Socially, she continues to work as a NP. She is very active and exercises. She has no plans for further child bearing. She denies family history of breast cancer. Her PGF had a prostate issue but not cancer.   Today, she presents by herself. She feels well in general, except fatigue after exercising. Reports Hgb 8.7 on 1/31 with physical, so she increased oral iron to BID, with vitamin C, and added mag to help with constipation. Hgb checked at work 2 weeks ago and was up to 10.4. she is open to IV iron now. She has heavy menses, LMP 2/11. Pelvis US shows fibroids. Will be seeing ob/gyn at [facility] soon. Denies signs of recurrent thrombosis/DVT  ROS  All other systems reviewed and negative   Past Medical History:  Diagnosis Date   Anemia    Breast cancer ([place]) 08/02/2022   PE (pulmonary thromboembolism) ([place])      Past Surgical History:  Procedure Laterality Date   BREAST BIOPSY Left 08/02/2022   [place] LT BREAST BX W LOC DEV 1ST LESION IMG BX SPEC US GUIDE 08/02/2022 [facility]   BREAST  BIOPSY Left 08/02/2022   MM LT BREAST BX W LOC DEV 1ST LESION IMAGE BX SPEC STEREO GUIDE 08/02/2022 [facility]     Outpatient Encounter Medications as of 08/16/2022  Medication Sig   calcium carbonate (SUPER CALCIUM) 1500 (600 Ca) MG TABS tablet 1 tablet with meals Orally once a day   ferrous sulfate 325 (65 FE) MG tablet TAKE 1 TABLET BY MOUTH EVERY DAY WITH BREAKFAST   Magnesium 400 MG CAPS 1 capsule with food Orally Once a day   Multiple Vitamins-Minerals (MULTIVITAMIN ADULT EXTRA C PO) Take 1 capsule by mouth daily.   [DISCONTINUED] rivaroxaban (XARELTO) 20 MG TABS tablet Take 20 mg by mouth daily with supper. (Patient not taking: Reported on 08/14/2022)   No facility-administered encounter medications on file as of 08/16/2022.     There were no vitals filed for this visit. There is no height or weight on file to calculate BMI.   PHYSICAL EXAM GENERAL:alert, no distress and comfortable SKIN: no rash  EYES: sclera clear NECK: without mass LYMPH:  no palpable cervical or supraclavicular lymphadenopathy  LUNGS: normal breathing effort HEART: no lower extremity edema ABDOMEN: abdomen soft, non-tender and normal bowel sounds NEURO: alert & oriented x 3 with fluent speech, no focal motor/sensory deficits Breast exam: Symmetric without nipple discharge or inversion.  Palpable firm mobile mass in the upper outer left breast measuring 1-1.5 cm.  Other palpable mass or nodularity in either breast or axilla that I could appreciate.   CBC    Component Value Date/Time   WBC 3.6 (L) 08/16/2022 1210   WBC 5.0 10/10/2018  1632   RBC 4.46 08/16/2022 1210   HGB 11.5 (L) 08/16/2022 1210   HCT 36.0 08/16/2022 1210   PLT 352 08/16/2022 1210   MCV 80.7 08/16/2022 1210   MCH 25.8 (L) 08/16/2022 1210   MCHC 31.9 08/16/2022 1210   RDW 23.4 (H) 08/16/2022 1210   LYMPHSABS 1.7 08/16/2022 1210   MONOABS 0.3 08/16/2022 1210   EOSABS 0.5 08/16/2022 1210   BASOSABS 0.0 08/16/2022 1210      CMP     Component Value Date/Time   NA 140 04/09/2019 1157   K 4.1 04/09/2019 1157   CL 105 04/09/2019 1157   CO2 27 04/09/2019 1157   GLUCOSE 90 04/09/2019 1157   BUN 5 (L) 04/09/2019 1157   CREATININE 0.76 04/09/2019 1157   CALCIUM 9.0 04/09/2019 1157   PROT 7.0 04/09/2019 1157   ALBUMIN 4.1 04/09/2019 1157   AST 21 04/09/2019 1157   ALT 23 04/09/2019 1157   ALKPHOS 59 04/09/2019 1157   BILITOT 0.4 04/09/2019 1157   GFRNONAA >60 04/09/2019 1157   GFRAA >60 04/09/2019 1157     ASSESSMENT & PLAN: 41 yo pre-menopausal female    1.  Multifocal left breast cancer  -Upper outer quadrant left breast invasive ductal carcinoma, G2, with DCIS, ER positive, PR positive, HER2 negative, Ki-67 30% -Lateral left breast invasive ductal carcinoma, grade 2, with DCIS, ER positive, PR weakly positive, HER2 negative, Ki-67 10% -Self palpated the mass herself, diagnostic mammo/ultrasound and biopsy confirmed invasive ductal adenocarcinoma in the upper outer and lateral left breast -She is planning lumpectomy and sentinel lymph node biopsy by Dr. Ninfa Linden on 3/7.  She underwent genetic testing today -We discussed the role of Oncotype testing to further stratify her recurrence risk and determine the benefit of adjuvant chemotherapy.  -If Oncotype RS < 20, we would not recommend chemo.  -If Oncotype RS 20-25, there is some chemo benefit in her age group and we would need to discuss with her.   -If Oncotype >26 this is high risk and therefore we would recommend adjuvant chemo.  She is open to this test and learning her risk. She is young and healthy, would be a good candidate for chemo if needed -If no adjuvant chemo, she will proceed with post lumpectomy radiation by Dr. Lisbeth Renshaw  -We discussed the role/benefit of anti-estrogen therapy to reduce recurrence risk.  -She is pre-menopausal therefore tamoxifen would be the agent of choice. However, she has h/o unprovoked PE, and tamoxifen would increase her  risk of subsequent thrombosis.  -We discussed higher efficacy of ovarian suppression (either BSO or monthly zoladex inj) plus AI vs tamoxifen alone. She is leaning toward this -If Oncotype <20, we will see her during the last week of radiation, otherwise will see her after surgery to discuss chemo. -Pt seen with Dr. Burr Medico    2. 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 Burr Medico suggested short course of post op xarelto, but due to increased menorrhagia while on xarelto in the past, pt declined  -no s/sx of recurrent  thrombosis    3. IDA secondary to menorrhagia  -Ferritin 5 on 11/07/2018, she has taken oral iron intermittently in the past. Tolerates moderately well with constipation which is improved with magnesium -Ferritin improved to 37 in 03/2019 -Recent pelvic US showed fibroids measuring up to 6 cm, normal endometrial thickness. She will see ob/gyn soon. Given her ER+ breast cancer we would not advise estrogen containing products   -today's CBC shows hgb 11.5, ANC 1.1 (mild neutropenia since 10/2018) -Iron panel is pending. She is open to IV iron now, to improve her anemia prior to breast surgery    PLAN: -Breast imaging and biopsy reviewed -Proceed with lumpectomy and SLNB Ninfa Linden) on 3/7 as scheduled -Request oncotype on surgical sample -If Oncotype <20, will see pt after adjuvant radiation, otherwise will see her post op to discuss chemo -Lab  today, genetics labs are pending -IV iron in 1 and 2 weeks, before surgery -Pt seen with Dr. Burr Medico      Orders Placed This Encounter  Procedures   CBC with Differential ([place] Only)    Standing Status:   Standing    Number of Occurrences:   20    Standing Expiration Date:   08/17/2023   CMP ([place] only)    Standing Status:   Standing    Number of Occurrences:   20    Standing Expiration Date:   08/17/2023   Ferritin    Standing Status:   Standing    Number of Occurrences:   20    Standing Expiration Date:   08/17/2023   Iron and Iron Binding Capacity ([facility] only)    Standing Status:   Standing    Number of Occurrences:   20    Standing Expiration Date:   08/17/2023   Vitamin D 25 hydroxy    Standing Status:   Standing    Number of Occurrences:   1    Standing Expiration Date:   08/17/2023      All questions were answered. The patient knows to call the clinic with any problems, questions or concerns. No barriers to learning were detected.   Cira Rue, NP-C 08/16/2022  Addendum  I have seen the patient, examined her. I agree with the assessment and and plan and have edited the notes.   Marvis is here to discuss her recently diagnosed multifocal left breast cancer.  We discussed her mammogram, ultrasound findings and her biopsy results, including prognostic panel results.  Given the early stage disease, she will proceed with lumpectomy and sentinel lymph node biopsy surgery first, which is scheduled for August 30, 2022.  I recommend Oncotype on her surgical sample, to determine her risk of recurrence, and the need for adjuvant chemotherapy.  Given the strong ER positive disease, HER2 negative, this is likely low risk disease.  We discussed the benefit of adjuvant radiation, and antiestrogen therapy.  Given her young age, we also discussed ovarian suppression or oophorectomy, to improve her cancer outcome.  All questions were answered.  We also reviewed her anemia  and iron deficiency issue.  She has chronic anemia from menorrhagia and iron deficiency, sometime hemoglobin dropped to low 8.  She does not tolerate oral iron well, she is open to IV iron now, given the coming breast surgery.  We discussed the benefit and the potential side effect, especially allergy reactions from IV iron.  She agrees to proceed.  Will see her back after surgery, or towards the end of her radiation, depends on  her Oncotype results.  Truitt Merle MD 08/16/2022

## 2022-08-17 ENCOUNTER — Encounter: Payer: Self-pay | Admitting: Nurse Practitioner

## 2022-08-17 ENCOUNTER — Telehealth: Payer: Self-pay | Admitting: *Deleted

## 2022-08-17 ENCOUNTER — Telehealth: Payer: Self-pay | Admitting: Nurse Practitioner

## 2022-08-17 ENCOUNTER — Encounter: Payer: Self-pay | Admitting: *Deleted

## 2022-08-17 NOTE — Progress Notes (Addendum)
REFERRING PROVIDER: Coralie Keens, MD [address] [place],  [place] [id number]  PRIMARY PROVIDER:  Kelton Pillar, MD  PRIMARY REASON FOR VISIT:  1. Primary malignant neoplasm of upper outer quadrant of left breast ([facility])    HISTORY OF PRESENT ILLNESS:   Tanya Rangel, a 42 y.o. female, was seen for a [facility] cancer genetics consultation at the request of Dr. Ninfa Linden due to a personal history of cancer.  Tanya Rangel presents to clinic today to discuss the possibility of a hereditary predisposition to cancer, to discuss genetic testing, and to further clarify her future cancer risks, as well as potential cancer risks for family members.   In February 2024, at the age of 33, Tanya Rangel was diagnosed with invasive ductal carcinoma of the left breast (ER/PR positive, HER2 negative).  CANCER HISTORY:  Oncology History  Primary malignant neoplasm of upper outer quadrant of left breast ([place])  07/25/2022 Imaging   Mammo/US IMPRESSION: 1. Irregular 1.2 cm mass in left breast at 2 o'clock, 5 cm the nipple, corresponding to the palpable lump, highly suspicious breast malignancy. 2. Small group calcifications in the posterior, lateral left breast, without change compared to prior exams dating back to 0/24/2022. Given the stability, these are likely to be benign calcifications, but have some increased suspicion due to adjacent palpable.   08/02/2022 Initial Biopsy   1. Breast, left, needle core biopsy, 2 o'clock, 5cmfn (ribbon) INVASIVE MODERATELY DIFFERENTIATED DUCTAL ADENOCARCINOMA, GRADE 2 (3+2+1) FOCAL DUCTAL CARCINOMA IN SITU, INTERMEDIATE NUCLEAR GRADE, SOLID TYPE WITHOUT NECROSIS TUBULE FORMATION: SCORE 3 NUCLEAR PLEOMORPHISM: SCORE 2 MITOTIC COUNT: SCORE 1 TOTAL SCORE: 6 OF 9 OVERALL GRADE: GRADE 2 NEGATIVE FOR LYMPHOVASCULAR INVASION NEGATIVE FOR MICROCALCIFICATIONS INVASIVE TUMOR MEASURES 6 MM IN GREATEST LINEAR EXTENT  1. Breast, left, needle core biopsy, 2  o'clock, 5 cmfn (ribbon) PROGNOSTIC INDICATORS  The tumor cells are equivocal for Her2 (2+). Her2 by FISH will be performed and the results reported separately. Estrogen Receptor: 95%, POSITIVE, MODERATE-STRONG STAINING INTENSITY Progesterone Receptor: 50%, POSITIVE, MODERATE-STRONG STAINING INTENSITY Proliferation Marker Ki67: 30%  1. Breast,left, needle core biopsy,2.o'clock,5 cmfn (ribbon) FLUORESCENCE IN-SITU HYBRIDIZATION Results: GROUP 5: HER2 **NEGATIVE**  2. Breast, left, needle core biopsy, UOQ (coil) INVASIVE MODERATELY DIFFERENTIATED DUCTAL ADENOCARCINOMA, GRADE 2 (3+2+1) FOCAL DUCTAL CARCINOMA IN SITU, INTERMEDIATE NUCLEAR GRADE NECROSIS TUBULE FORMATION: SCORE 3 NUCLEAR PLEOMORPHISM: SCORE 2 MITOTIC COUNT: SCORE 1 TOTAL SCORE: 6 OF 9 OVERALL GRADE: GRADE 2 MICROCALCIFICATIONS PRESENT WITHIN PERITUMORAL STROMA, DCIS AND COLUMNAR CELL CHANGE NEGATIVE FOR ANGIOLYMPHATIC INVASION BACKGROUND FIBROCYSTIC CHANGES INCLUDING STROMAL FIBROSIS, CYSTIC DILATATION OF DUCTS, APOCRINE METAPLASIA AND COLUMNAR CELL CHANGE TUMOR MEASURES 1.25 MM IN GREATEST LINEAR EXTENT  2. Breast, left, needle core biopsy, UOQ (coil) PROGNOSTIC INDICATORS  The tumor cells are equivocal for Her2 (2+). Her2 by FISH will be performed and the results reported separately. Estrogen Receptor: 99%, POSITIVE, STRONG STAINING INTENSITY Progesterone Receptor: 30%, POSITIVE, WEAK-MODERATE STAINING INTENSITY Proliferation Marker Ki67: 10%  2. Breast, left, needle core biopsy, UOQ(coil) FLUORESCENCE IN-SITU HYBRIDIZATION Results: GROUP 5: HER2 **NEGATIVE**     08/15/2022 Initial Diagnosis   Primary malignant neoplasm of upper outer quadrant of left breast ([place])   08/15/2022 Cancer Staging   Staging form: Breast, AJCC 8th Edition - Clinical stage from 08/15/2022: Stage IA (cT1c, cN0, cM0, G2, ER+, PR+, HER2-) - Signed by Hayden Pedro, PA-C on 08/15/2022 Stage prefix: Initial diagnosis Method of  lymph node assessment: Clinical Histologic grading system: 3 grade system  RISK FACTORS:  First live birth at age 53.  OCP use for approximately 2 years.  Ovaries intact: yes.  Uterus intact: yes.  Menopausal status: premenopausal.  HRT use: 0 years. Colonoscopy: no Mammogram within the last year: yes. Any excessive radiation exposure in the past: no  Past Medical History:  Diagnosis Date   Anemia    Breast cancer ([place]) 08/02/2022   PE (pulmonary thromboembolism) ([place])     Past Surgical History:  Procedure Laterality Date   BREAST BIOPSY Left 08/02/2022   [place] LT BREAST BX W LOC DEV 1ST LESION IMG BX [place] US GUIDE 08/02/2022 [facility]   BREAST BIOPSY Left 08/02/2022   MM LT BREAST BX W LOC DEV 1ST LESION IMAGE BX [place] STEREO GUIDE 08/02/2022 [facility]    Social History   Socioeconomic History   Marital status: Married    Spouse name: Not on file   Number of children: Not on file   Years of education: Not on file   Highest education level: Not on file  Occupational History   Not on file  Tobacco Use   Smoking status: Never   Smokeless tobacco: Never  Vaping Use   Vaping Use: Never used  Substance and Sexual Activity   Alcohol use: Never   Drug use: Never   Sexual activity: Not on file  Other Topics Concern   Not on file  Social History Narrative   Not on file   Social Determinants of Health   Financial Resource Strain: Not on file  Food Insecurity: No Food Insecurity (08/16/2022)   Hunger Vital Sign    Worried About Running Out of Food in the Last Year: Never true    Ran Out of Food in the Last Year: Never true  Transportation Needs: No Transportation Needs (08/16/2022)   PRAPARE - Transportation    Lack of Transportation (Medical): No    Lack of Transportation (Non-Medical): No  Physical Activity: Not on file  Stress: Not on file  Social Connections: Not on file     FAMILY HISTORY:  We obtained a detailed, 4-generation family history.   Significant diagnoses are listed below:  Tanya Rangel is unaware of previous family history of cancer and/or genetic testing for hereditary cancer risks. There is no reported Ashkenazi Jewish ancestry.      GENETIC COUNSELING ASSESSMENT: Tanya Rangel is a 42 y.o. female with a personal history of cancer which is somewhat suggestive of a hereditary predisposition to cancer given her young age at diagnosis. We, therefore, discussed and recommended the following at today's visit.   DISCUSSION: We discussed that 5 - 10% of cancer is hereditary, with most cases of breast cancer associated with BRCA1/2.  There are other genes that can be associated with hereditary breast cancer syndromes.  We discussed that testing is beneficial for several reasons including knowing how to follow individuals after completing their treatment, identifying whether potential treatment options would be beneficial, and understanding if other family members could be at risk for cancer and allowing them to undergo genetic testing.   We reviewed the characteristics, features and inheritance patterns of hereditary cancer syndromes. We also discussed genetic testing, including the appropriate family members to test, the process of testing, insurance coverage and turn-around-time for results. We discussed the implications of a negative, positive, carrier and/or variant of uncertain significant result. We recommended Tanya Rangel pursue genetic testing for a panel that includes genes associated with breast cancer.   Tanya Rangel does not want a  STAT panel. She reports her surgical decision to have a lumpectomy would not change based on her genetic testing results. She elected to have [facility] Custom Cancer Panel. The Custom Hereditary Cancers Panel offered by [facility] includes sequencing and/or deletion duplication testing of the following 43 genes: APC, ATM, AXIN2, BAP1, BARD1, BMPR1A, BRCA1, BRCA2, BRIP1, CDH1, CDK4, CDKN2A (p14ARF and  p16INK4a only), CHEK2, CTNNA1, EPCAM (Deletion/duplication testing only), FH, GREM1 (promoter region duplication testing only), HOXB13, KIT, MBD4, MEN1, MLH1, MSH2, MSH3, MSH6, MUTYH, NF1, NHTL1, PALB2, PDGFRA, PMS2, POLD1, POLE, PTEN, RAD51C, RAD51D, SMAD4, SMARCA4. STK11, TP53, TSC1, TSC2, and VHL.  Based on Tanya Rangel's personal history of cancer, she meets medical criteria for genetic testing. Despite that she meets criteria, she may still have an out of pocket cost. We discussed that if her out of pocket cost for testing is over $100, the laboratory will call and confirm whether she wants to proceed with testing.  If the out of pocket cost of testing is less than $100 she will be billed by the genetic testing laboratory.   PLAN: After considering the risks, benefits, and limitations, Tanya Rangel provided informed consent to pursue genetic testing and the blood sample was sent to [facility] for analysis of the Custom Cancer Panel. Results should be available within approximately 2-3 weeks' time, at which point they will be disclosed by telephone to Tanya Rangel, as will any additional recommendations warranted by these results. Tanya Rangel will receive a summary of her genetic counseling visit and a copy of her results once available. This information will also be available in Epic.   Tanya Rangel questions were answered to her satisfaction today. Our contact information was provided should additional questions or concerns arise. Thank you for the referral and allowing [place] to share in the care of your patient.   Lucille Passy, MS, [facility] Genetic Counselor [email or web address] (P) [phone]  The patient was seen for a total of 25 minutes in face-to-face genetic counseling. The patient was seen alone.  Drs. Lindi Adie and/or Burr Medico were available to discuss this case as needed.   _______________________________________________________________________ For Office Staff:  Number  of people involved in session: 1 Was an Intern/ student involved with case: no

## 2022-08-17 NOTE — Telephone Encounter (Signed)
Contacted patient to scheduled appointments. Patient is aware of appointments that are scheduled.   

## 2022-08-17 NOTE — Telephone Encounter (Signed)
Called pt to discuss navigation resources and provide contact information. Left vm with this information.

## 2022-08-22 ENCOUNTER — Other Ambulatory Visit: Payer: Self-pay

## 2022-08-22 ENCOUNTER — Inpatient Hospital Stay: Payer: BC Managed Care – PPO

## 2022-08-22 VITALS — BP 119/78 | HR 74 | Temp 98.1°F | Resp 18

## 2022-08-22 DIAGNOSIS — D500 Iron deficiency anemia secondary to blood loss (chronic): Secondary | ICD-10-CM

## 2022-08-22 DIAGNOSIS — C50412 Malignant neoplasm of upper-outer quadrant of left female breast: Secondary | ICD-10-CM | POA: Diagnosis not present

## 2022-08-22 MED ORDER — ACETAMINOPHEN 325 MG PO TABS
650.0000 mg | ORAL_TABLET | Freq: Once | ORAL | Status: AC
  Administered 2022-08-22: 650 mg via ORAL
  Filled 2022-08-22: qty 2

## 2022-08-22 MED ORDER — SODIUM CHLORIDE 0.9 % IV SOLN: Freq: Once | INTRAVENOUS | Status: AC

## 2022-08-22 MED ORDER — LORATADINE 10 MG PO TABS
10.0000 mg | ORAL_TABLET | Freq: Every day | ORAL | Status: DC
  Administered 2022-08-22: 10 mg via ORAL
  Filled 2022-08-22: qty 1

## 2022-08-22 MED ORDER — SODIUM CHLORIDE 0.9 % IV SOLN
400.0000 mg | Freq: Once | INTRAVENOUS | Status: AC
  Administered 2022-08-22: 400 mg via INTRAVENOUS
  Filled 2022-08-22: qty 20

## 2022-08-22 NOTE — Patient Instructions (Signed)

## 2022-08-24 NOTE — Pre-Procedure Instructions (Signed)
Surgical Instructions    Your procedure is scheduled on August 30, 2022.  Report to [facility] Main Entrance "A" at 05:30 A.M., then check in with the Admitting office.  Call this number if you have problems the morning of surgery:  [phone]   If you have any questions prior to your surgery date call [phone]: Open Monday-Friday 8am-4pm If you experience any cold or flu symptoms such as cough, fever, chills, shortness of breath, etc. between now and your scheduled surgery, please notify us at the above number     Remember:  Do not eat after midnight the night before your surgery  You may drink clear liquids until 04:30am the morning of your surgery.   Clear liquids allowed are: Water, Non-Citrus Juices (without pulp), Carbonated Beverages, Clear Tea, Black Coffee ONLY (NO MILK, CREAM OR POWDERED CREAMER of any kind), and Gatorade   Enhanced Recovery after Surgery Enhanced Recovery after Surgery is a protocol used to improve the stress on your body and your recovery after surgery.  Patient Instructions  The day of surgery (if you do NOT have diabetes):  Drink ONE (1) Pre-Surgery Clear Ensure by 04:30 am the morning of surgery   This drink was given to you during your hospital  pre-op appointment visit. Nothing else to drink after completing the  Pre-Surgery Clear Ensure.          If you have questions, please contact your surgeon's office.     Take these medicines the morning of surgery with A SIP OF WATER: None   As of today, STOP taking any Aspirin (unless otherwise instructed by your surgeon) Aleve, Naproxen, Ibuprofen, Motrin, Advil, Goody's, BC's, all herbal medications, fish oil, and all vitamins.           Do not wear jewelry or makeup. Do not wear lotions, powders, perfumes or deodorant. Do not shave 48 hours prior to surgery.   Do not bring valuables to the hospital. Do not wear nail polish, gel polish, artificial nails, or any other type of covering on  natural nails (fingers and toes) If you have artificial nails or gel coating that need to be removed by a nail salon, please have this removed prior to surgery. Artificial nails or gel coating may interfere with anesthesia's ability to adequately monitor your vital signs.  [facility] is not responsible for any belongings or valuables.    Do NOT Smoke (Tobacco/Vaping)  24 hours prior to your procedure  If you use a CPAP at night, you may bring your mask for your overnight stay.   Contacts, glasses, hearing aids, dentures or partials may not be worn into surgery, please bring cases for these belongings   For patients admitted to the hospital, discharge time will be determined by your treatment team.   Patients discharged the day of surgery will not be allowed to drive home, and someone needs to stay with them for 24 hours.   SURGICAL WAITING ROOM VISITATION Patients having surgery or a procedure may have no more than 2 support people in the waiting area - these visitors may rotate.   Children under the age of 20 must have an adult with them who is not the patient. If the patient needs to stay at the hospital during part of their recovery, the visitor guidelines for inpatient rooms apply. Pre-op nurse will coordinate an appropriate time for 1 support person to accompany patient in pre-op.  This support person may not rotate.   Please refer to  [email or web address] for the visitor guidelines for Inpatients (after your surgery is over and you are in a regular room).    Special instructions:    Oral Hygiene is also important to reduce your risk of infection.  Remember - BRUSH YOUR TEETH THE MORNING OF SURGERY WITH YOUR REGULAR TOOTHPASTE   [facility]- Preparing For Surgery  Before surgery, you can play an important role. Because skin is not sterile, your skin needs to be as free of germs as possible. You can reduce the number of germs on  your skin by washing with CHG (chlorahexidine gluconate) Soap before surgery.  CHG is an antiseptic cleaner which kills germs and bonds with the skin to continue killing germs even after washing.     Please do not use if you have an allergy to CHG or antibacterial soaps. If your skin becomes reddened/irritated stop using the CHG.  Do not shave (including legs and underarms) for at least 48 hours prior to first CHG shower. It is OK to shave your face.  Please follow these instructions carefully.     Shower the NIGHT BEFORE SURGERY and the MORNING OF SURGERY with CHG Soap.   If you chose to wash your hair, wash your hair first as usual with your normal shampoo. After you shampoo, rinse your hair and body thoroughly to remove the shampoo.  Then [employer] and genitals (private parts) with your normal soap and rinse thoroughly to remove soap.  After that Use CHG Soap as you would any other liquid soap. You can apply CHG directly to the skin and wash gently with a scrungie or a clean washcloth.   Apply the CHG Soap to your body ONLY FROM THE NECK DOWN.  Do not use on open wounds or open sores. Avoid contact with your eyes, ears, mouth and genitals (private parts). Wash Face and genitals (private parts)  with your normal soap.   Wash thoroughly, paying special attention to the area where your surgery will be performed.  Thoroughly rinse your body with warm water from the neck down.  DO NOT shower/wash with your normal soap after using and rinsing off the CHG Soap.  Pat yourself dry with a CLEAN TOWEL.  Wear CLEAN PAJAMAS to bed the night before surgery  Place CLEAN SHEETS on your bed the night before your surgery  DO NOT SLEEP WITH PETS.   Day of Surgery:  Take a shower with CHG soap. Wear Clean/Comfortable clothing the morning of surgery Do not apply any deodorants/lotions.   Remember to brush your teeth WITH YOUR REGULAR TOOTHPASTE.    If you received a COVID test during your  pre-op visit, it is requested that you wear a mask when out in public, stay away from anyone that may not be feeling well, and notify your surgeon if you develop symptoms. If you have been in contact with anyone that has tested positive in the last 10 days, please notify your surgeon.    Please read over the following fact sheets that you were given.

## 2022-08-27 ENCOUNTER — Encounter (HOSPITAL_COMMUNITY)
Admission: RE | Admit: 2022-08-27 | Discharge: 2022-08-27 | Disposition: A | Discharge status: Home / Self Care | Payer: BC Managed Care – PPO | Source: Ambulatory Visit | Attending: Surgery | Admitting: Surgery

## 2022-08-27 ENCOUNTER — Inpatient Hospital Stay: Payer: BC Managed Care – PPO | Attending: Hematology

## 2022-08-27 ENCOUNTER — Other Ambulatory Visit: Payer: Self-pay

## 2022-08-27 ENCOUNTER — Telehealth: Payer: Self-pay | Admitting: Genetic Counselor

## 2022-08-27 ENCOUNTER — Encounter (HOSPITAL_COMMUNITY): Payer: Self-pay

## 2022-08-27 VITALS — BP 105/63 | HR 68 | Temp 97.9°F | Resp 16 | Ht 65.0 in | Wt 143.0 lb

## 2022-08-27 VITALS — BP 94/59 | HR 68 | Temp 98.2°F | Resp 16

## 2022-08-27 DIAGNOSIS — Z01818 Encounter for other preprocedural examination: Secondary | ICD-10-CM

## 2022-08-27 DIAGNOSIS — Z1379 Encounter for other screening for genetic and chromosomal anomalies: Secondary | ICD-10-CM | POA: Insufficient documentation

## 2022-08-27 DIAGNOSIS — Z86718 Personal history of other venous thrombosis and embolism: Secondary | ICD-10-CM | POA: Diagnosis not present

## 2022-08-27 DIAGNOSIS — Z01812 Encounter for preprocedural laboratory examination: Secondary | ICD-10-CM | POA: Insufficient documentation

## 2022-08-27 DIAGNOSIS — Z09 Encounter for follow-up examination after completed treatment for conditions other than malignant neoplasm: Secondary | ICD-10-CM | POA: Diagnosis not present

## 2022-08-27 DIAGNOSIS — D500 Iron deficiency anemia secondary to blood loss (chronic): Secondary | ICD-10-CM | POA: Diagnosis present

## 2022-08-27 DIAGNOSIS — D259 Leiomyoma of uterus, unspecified: Secondary | ICD-10-CM | POA: Insufficient documentation

## 2022-08-27 DIAGNOSIS — N920 Excessive and frequent menstruation with regular cycle: Secondary | ICD-10-CM | POA: Diagnosis not present

## 2022-08-27 DIAGNOSIS — Z170 Estrogen receptor positive status [ER+]: Secondary | ICD-10-CM | POA: Diagnosis not present

## 2022-08-27 DIAGNOSIS — C50412 Malignant neoplasm of upper-outer quadrant of left female breast: Secondary | ICD-10-CM | POA: Diagnosis present

## 2022-08-27 HISTORY — DX: Personal history of other diseases of the female genital tract: Z87.42

## 2022-08-27 LAB — POCT PREGNANCY, URINE: Preg Test, Ur: NEGATIVE

## 2022-08-27 MED ORDER — LORATADINE 10 MG PO TABS
10.0000 mg | ORAL_TABLET | Freq: Every day | ORAL | Status: DC
  Administered 2022-08-27: 10 mg via ORAL
  Filled 2022-08-27: qty 1

## 2022-08-27 MED ORDER — SODIUM CHLORIDE 0.9 % IV SOLN
400.0000 mg | Freq: Once | INTRAVENOUS | Status: AC
  Administered 2022-08-27: 400 mg via INTRAVENOUS
  Filled 2022-08-27: qty 20

## 2022-08-27 MED ORDER — SODIUM CHLORIDE 0.9 % IV SOLN: Freq: Once | INTRAVENOUS | Status: AC

## 2022-08-27 MED ORDER — ACETAMINOPHEN 325 MG PO TABS
650.0000 mg | ORAL_TABLET | Freq: Once | ORAL | Status: AC
  Administered 2022-08-27: 650 mg via ORAL
  Filled 2022-08-27: qty 2

## 2022-08-27 NOTE — Telephone Encounter (Signed)
I contacted Ms. Zou to discuss her genetic testing results. No pathogenic variants were identified in the 43 genes analyzed. Detailed clinic note to follow.  The test report has been scanned into EPIC and is located under the Molecular Pathology section of the Results Review tab.  A portion of the result report is included below for reference.   Lucille Passy, MS, [facility] Genetic Counselor [email or web address] (P) [phone]

## 2022-08-27 NOTE — Progress Notes (Signed)
PCP - Kelton Pillar, MD- [facility] Cardiologist - denies  PPM/ICD - denies  Chest x-ray - N/A EKG - N/A Stress Test - denies  ECHO - denies Cardiac Cath - denies  Sleep Study - denies  Fasting Blood Sugar - N/A  Last dose of GLP1 agonist-  N/A  Blood Thinner Instructions: N/A Aspirin Instructions: N/A  ERAS Protcol - ERAS + ensure   COVID TEST- N/A Urine preg negative at PAT.   Anesthesia review: yes, seed placement 08/29/22  Patient denies shortness of breath, fever, cough and chest pain at PAT appointment   All instructions explained to the patient, with a verbal understanding of the material. Patient agrees to go over the instructions while at home for a better understanding.  The opportunity to ask questions was provided.

## 2022-08-27 NOTE — Patient Instructions (Signed)

## 2022-08-29 ENCOUNTER — Ambulatory Visit
Admission: RE | Admit: 2022-08-29 | Discharge: 2022-08-29 | Disposition: A | Discharge status: Home / Self Care | Payer: BC Managed Care – PPO | Source: Ambulatory Visit | Attending: Surgery | Admitting: Surgery

## 2022-08-29 ENCOUNTER — Encounter (HOSPITAL_COMMUNITY): Payer: Self-pay | Admitting: Surgery

## 2022-08-29 DIAGNOSIS — Z853 Personal history of malignant neoplasm of breast: Secondary | ICD-10-CM

## 2022-08-29 HISTORY — PX: BREAST BIOPSY: SHX20

## 2022-08-29 NOTE — H&P (Signed)
REFERRING PHYSICIAN: Mitzi Hansen, MD PROVIDER: Beverlee Nims, MD MRN: [id number] DOB: 09-30-1980 DATE OF ENCOUNTER: 08/13/2022 Subjective  Chief Complaint: New Consultation ( Lt Br CA )  History of Present Illness: Tanya Rangel is a 42 y.o. female who is seen today as an office consultation for evaluation of New Consultation ( Lt Br CA )  This is a 42 year old female referred for evaluation of the new diagnosis of the left breast cancer. She found a small mass in the upper outer quadrant of the left breast in December. She underwent mammograms and ultrasound showing a 1.2 cm mass at the 2 o'clock position of the left breast and deeper in the breast there were calcifications measuring 7 mm. Both areas were biopsied and both showed both invasive ductal carcinoma and ductal carcinoma in situ. The larger area showed 95% ER positivity, 50% PR positive, and a Ki-67 of 30%. It was HER2 negative. The other area showed 99% ER positive, 30% PR positive, 10% Ki-67, and HER2 negative as well. Ultrasound the axilla was negative. She has had no previous problems regarding her breast. She denies nipple discharge. There is no family history of breast cancer. She is a Designer, jewellery at [employer]. She has no cardiopulmonary issues. She has a previous history of having had a DVT but is no longer on therapy  Review of Systems: A complete review of systems was obtained from the patient. I have reviewed this information and discussed as appropriate with the patient. See HPI as well for other ROS.  ROS  Medical History: Past Medical History: Diagnosis Date Anemia DVT (deep venous thrombosis) (CMS-HCC) History of cancer Hyperlipidemia  There is no problem list on file for this patient.  Past Surgical History: Procedure Laterality Date CESAREAN SECTION   No Known Allergies  Current Outpatient Medications on File Prior to Visit Medication Sig Dispense Refill ferrous  fumarate/iron ps cplx (FERROUS FUMARATE-IRON PS CMPLX ORAL) Take by mouth multivit 46-iron-folate 6-dha 29 mg iron-1 mg -300 mg Cap Take 1 capsule by mouth once daily  No current facility-administered medications on file prior to visit.  Family History Problem Relation Age of Onset High blood pressure (Hypertension) Father Hyperlipidemia (Elevated cholesterol) Father   Social History  Tobacco Use Smoking Status Never Smokeless Tobacco Never   Social History  Socioeconomic History Marital status: Unknown Tobacco Use Smoking status: Never Smokeless tobacco: Never Substance and Sexual Activity Alcohol use: Never Drug use: Never  Objective:  Vitals: 08/13/22 1008 BP: 106/62 Pulse: 63 Temp: 36.3 C (97.3 F) SpO2: 98% Weight: 63 kg (139 lb) Height: 165.1 cm ('5\' 5"'$ )  Body mass index is 23.13 kg/m.  Physical Exam  She appears well on exam.  I can palpate the small mass in the upper outer quadrant of the left breast. Her breast are lumpy bumpy in this area. There was a hematoma present per her report. I cannot palpate any other masses.  The nipple areolar complex is normal.  There is no axillary adenopathy  Labs, Imaging and Diagnostic Testing: I have reviewed her mammograms, ultrasound, and pathology results.  Assessment and Plan:  Diagnoses and all orders for this visit:  Invasive ductal carcinoma of breast, left (CMS-HCC) - Ambulatory Referral to Radiation Oncology - Ambulatory Referral to Physical Therapy - Ambulatory Referral to Cancer Genetics - [id number]   I gave the patient and her husband a copy of the pathology results. We discussed breast cancer in general. We discussed the multidisciplinary approach. From a surgical standpoint  we discussed breast conservation versus mastectomy. She is interested in breast conservation. I next discussed proceeding with a left breast radioactive seed guided lumpectomy. This would require 2 separate radioactive seeds  given the 2 separate areas. I also discussed the reasons for proceeding with a sentinel lymph node biopsy as well. I discussed the risk of surgery which includes but is not limited to bleeding, infection, injury to surrounding structures, the need for further surgery if margins are positive, lymphedema in the arm with lymph node biopsy, seroma formation, postoperative recovery, DVT, etc.

## 2022-08-30 ENCOUNTER — Ambulatory Visit
Admission: RE | Admit: 2022-08-30 | Discharge: 2022-08-30 | Disposition: A | Discharge status: Home / Self Care | Payer: BC Managed Care – PPO | Source: Ambulatory Visit | Attending: Surgery | Admitting: Surgery

## 2022-08-30 ENCOUNTER — Ambulatory Visit: Payer: Self-pay | Admitting: Genetic Counselor

## 2022-08-30 ENCOUNTER — Other Ambulatory Visit: Payer: Self-pay

## 2022-08-30 ENCOUNTER — Ambulatory Visit (HOSPITAL_COMMUNITY): Payer: BC Managed Care – PPO | Admitting: Physician Assistant

## 2022-08-30 ENCOUNTER — Encounter: Payer: Self-pay | Admitting: Genetic Counselor

## 2022-08-30 ENCOUNTER — Encounter (HOSPITAL_COMMUNITY): Payer: Self-pay | Admitting: Surgery

## 2022-08-30 ENCOUNTER — Ambulatory Visit (HOSPITAL_COMMUNITY)
Admission: RE | Admit: 2022-08-30 | Discharge: 2022-08-30 | Disposition: A | Discharge status: Home / Self Care | Payer: BC Managed Care – PPO | Attending: Surgery | Admitting: Surgery

## 2022-08-30 ENCOUNTER — Encounter (HOSPITAL_COMMUNITY)
Admission: RE | Disposition: A | Discharge status: Home / Self Care | Payer: Self-pay | Source: Home / Self Care | Attending: Surgery

## 2022-08-30 DIAGNOSIS — Z853 Personal history of malignant neoplasm of breast: Secondary | ICD-10-CM

## 2022-08-30 DIAGNOSIS — Z1379 Encounter for other screening for genetic and chromosomal anomalies: Secondary | ICD-10-CM

## 2022-08-30 DIAGNOSIS — Z170 Estrogen receptor positive status [ER+]: Secondary | ICD-10-CM | POA: Insufficient documentation

## 2022-08-30 DIAGNOSIS — Z86718 Personal history of other venous thrombosis and embolism: Secondary | ICD-10-CM | POA: Insufficient documentation

## 2022-08-30 DIAGNOSIS — Z09 Encounter for follow-up examination after completed treatment for conditions other than malignant neoplasm: Secondary | ICD-10-CM | POA: Insufficient documentation

## 2022-08-30 DIAGNOSIS — C50412 Malignant neoplasm of upper-outer quadrant of left female breast: Secondary | ICD-10-CM | POA: Diagnosis not present

## 2022-08-30 HISTORY — PX: BREAST LUMPECTOMY WITH RADIOACTIVE SEED AND SENTINEL LYMPH NODE BIOPSY: SHX6550

## 2022-08-30 SURGERY — BREAST LUMPECTOMY WITH RADIOACTIVE SEED AND SENTINEL LYMPH NODE BIOPSY
Anesthesia: Regional | Site: Breast | Laterality: Left

## 2022-08-30 MED ORDER — ORAL CARE MOUTH RINSE: 15.0000 mL | Freq: Once | OROMUCOSAL | Status: AC

## 2022-08-30 MED ORDER — BUPIVACAINE LIPOSOME 1.3 % IJ SUSP
INTRAMUSCULAR | Status: DC | PRN
  Administered 2022-08-30: 10 mL via PERINEURAL

## 2022-08-30 MED ORDER — CHLORHEXIDINE GLUCONATE 0.12 % MT SOLN: 15.0000 mL | Freq: Once | OROMUCOSAL | Status: AC

## 2022-08-30 MED ORDER — KETOROLAC TROMETHAMINE 30 MG/ML IJ SOLN: 30.0000 mg | Freq: Once | INTRAMUSCULAR | Status: DC | PRN

## 2022-08-30 MED ORDER — MIDAZOLAM HCL 2 MG/2ML IJ SOLN
INTRAMUSCULAR | Status: DC | PRN
  Administered 2022-08-30: 2 mg via INTRAVENOUS

## 2022-08-30 MED ORDER — MIDAZOLAM HCL 2 MG/2ML IJ SOLN
INTRAMUSCULAR | Status: AC
  Filled 2022-08-30: qty 2

## 2022-08-30 MED ORDER — CHLORHEXIDINE GLUCONATE CLOTH 2 % EX PADS: 6.0000 | MEDICATED_PAD | Freq: Once | CUTANEOUS | Status: DC

## 2022-08-30 MED ORDER — KETOROLAC TROMETHAMINE 30 MG/ML IJ SOLN
INTRAMUSCULAR | Status: DC | PRN
  Administered 2022-08-30: 30 mg via INTRAVENOUS

## 2022-08-30 MED ORDER — 0.9 % SODIUM CHLORIDE (POUR BTL) OPTIME
TOPICAL | Status: DC | PRN
  Administered 2022-08-30: 1000 mL

## 2022-08-30 MED ORDER — FENTANYL CITRATE (PF) 250 MCG/5ML IJ SOLN
INTRAMUSCULAR | Status: AC
  Filled 2022-08-30: qty 5

## 2022-08-30 MED ORDER — DEXAMETHASONE SODIUM PHOSPHATE 10 MG/ML IJ SOLN
INTRAMUSCULAR | Status: DC | PRN
  Administered 2022-08-30: 5 mg via INTRAVENOUS

## 2022-08-30 MED ORDER — ACETAMINOPHEN 500 MG PO TABS
ORAL_TABLET | ORAL | Status: AC
  Administered 2022-08-30: 1000 mg via ORAL
  Filled 2022-08-30: qty 2

## 2022-08-30 MED ORDER — METHYLENE BLUE 1 % INJ SOLN
INTRAVENOUS | Status: AC
  Filled 2022-08-30: qty 10

## 2022-08-30 MED ORDER — BUPIVACAINE LIPOSOME 1.3 % IJ SUSP
INTRAMUSCULAR | Status: AC
  Filled 2022-08-30: qty 10

## 2022-08-30 MED ORDER — OXYCODONE HCL 5 MG PO TABS: 5.0000 mg | ORAL_TABLET | Freq: Once | ORAL | Status: DC | PRN

## 2022-08-30 MED ORDER — ROPIVACAINE HCL 5 MG/ML IJ SOLN
INTRAMUSCULAR | Status: DC | PRN
  Administered 2022-08-30: 15 mL via PERINEURAL

## 2022-08-30 MED ORDER — ACETAMINOPHEN 500 MG PO TABS: 1000.0000 mg | ORAL_TABLET | Freq: Once | ORAL | Status: AC

## 2022-08-30 MED ORDER — CEFAZOLIN SODIUM-DEXTROSE 2-4 GM/100ML-% IV SOLN
INTRAVENOUS | Status: AC
  Filled 2022-08-30: qty 100

## 2022-08-30 MED ORDER — MEPERIDINE HCL 25 MG/ML IJ SOLN: 6.2500 mg | INTRAMUSCULAR | Status: DC | PRN

## 2022-08-30 MED ORDER — CEFAZOLIN SODIUM-DEXTROSE 2-4 GM/100ML-% IV SOLN
2.0000 g | INTRAVENOUS | Status: AC
  Administered 2022-08-30: 2 g via INTRAVENOUS

## 2022-08-30 MED ORDER — PROPOFOL 10 MG/ML IV BOLUS
INTRAVENOUS | Status: DC | PRN
  Administered 2022-08-30: 170 mg via INTRAVENOUS
  Administered 2022-08-30: 30 mg via INTRAVENOUS

## 2022-08-30 MED ORDER — AMISULPRIDE (ANTIEMETIC) 5 MG/2ML IV SOLN: 10.0000 mg | Freq: Once | INTRAVENOUS | Status: DC | PRN

## 2022-08-30 MED ORDER — LACTATED RINGERS IV SOLN: INTRAVENOUS | Status: DC

## 2022-08-30 MED ORDER — OXYCODONE HCL 5 MG PO TABS
ORAL_TABLET | ORAL | Status: AC
  Filled 2022-08-30: qty 1

## 2022-08-30 MED ORDER — PROPOFOL 10 MG/ML IV BOLUS
INTRAVENOUS | Status: AC
  Filled 2022-08-30: qty 20

## 2022-08-30 MED ORDER — CHLORHEXIDINE GLUCONATE 0.12 % MT SOLN
OROMUCOSAL | Status: AC
  Administered 2022-08-30: 15 mL via OROMUCOSAL
  Filled 2022-08-30: qty 15

## 2022-08-30 MED ORDER — PHENYLEPHRINE 80 MCG/ML (10ML) SYRINGE FOR IV PUSH (FOR BLOOD PRESSURE SUPPORT)
PREFILLED_SYRINGE | INTRAVENOUS | Status: DC | PRN
  Administered 2022-08-30: 80 ug via INTRAVENOUS

## 2022-08-30 MED ORDER — ENSURE PRE-SURGERY PO LIQD: 296.0000 mL | Freq: Once | ORAL | Status: DC

## 2022-08-30 MED ORDER — HYDROMORPHONE HCL 1 MG/ML IJ SOLN
INTRAMUSCULAR | Status: AC
  Filled 2022-08-30: qty 1

## 2022-08-30 MED ORDER — ONDANSETRON HCL 4 MG/2ML IJ SOLN: 4.0000 mg | Freq: Once | INTRAMUSCULAR | Status: DC | PRN

## 2022-08-30 MED ORDER — BUPIVACAINE-EPINEPHRINE (PF) 0.25% -1:200000 IJ SOLN
INTRAMUSCULAR | Status: AC
  Filled 2022-08-30: qty 30

## 2022-08-30 MED ORDER — OXYCODONE HCL 5 MG/5ML PO SOLN: 5.0000 mg | Freq: Once | ORAL | Status: DC | PRN

## 2022-08-30 MED ORDER — HYDROMORPHONE HCL 1 MG/ML IJ SOLN
0.2500 mg | INTRAMUSCULAR | Status: DC | PRN
  Administered 2022-08-30 (×3): 0.5 mg via INTRAVENOUS

## 2022-08-30 MED ORDER — BUPIVACAINE-EPINEPHRINE 0.25% -1:200000 IJ SOLN
INTRAMUSCULAR | Status: DC | PRN
  Administered 2022-08-30: 18 mL

## 2022-08-30 MED ORDER — TRAMADOL HCL 50 MG PO TABS: 50.0000 mg | ORAL_TABLET | Freq: Four times a day (QID) | ORAL | 0 refills | Status: DC | PRN

## 2022-08-30 MED ORDER — MAGTRACE LYMPHATIC TRACER
INTRAMUSCULAR | Status: DC | PRN
  Administered 2022-08-30: 2 mL via INTRAMUSCULAR

## 2022-08-30 MED ORDER — FENTANYL CITRATE (PF) 250 MCG/5ML IJ SOLN
INTRAMUSCULAR | Status: DC | PRN
  Administered 2022-08-30 (×2): 50 ug via INTRAVENOUS

## 2022-08-30 MED ORDER — ACETAMINOPHEN 500 MG PO TABS: 1000.0000 mg | ORAL_TABLET | ORAL | Status: DC

## 2022-08-30 MED ORDER — ONDANSETRON HCL 4 MG/2ML IJ SOLN
INTRAMUSCULAR | Status: DC | PRN
  Administered 2022-08-30: 4 mg via INTRAVENOUS

## 2022-08-30 SURGICAL SUPPLY — 39 items
ADH SKN CLS APL DERMABOND .7 (GAUZE/BANDAGES/DRESSINGS) ×1
APL PRP STRL LF DISP 70% ISPRP (MISCELLANEOUS) ×1
APPLIER CLIP 9.375 MED OPEN (MISCELLANEOUS) ×1
APR CLP MED 9.3 20 MLT OPN (MISCELLANEOUS) ×1
BAG COUNTER SPONGE SURGICOUNT (BAG) ×1 IMPLANT
BAG SPNG CNTER NS LX DISP (BAG) ×1
BINDER BREAST LRG (GAUZE/BANDAGES/DRESSINGS) IMPLANT
BINDER BREAST XLRG (GAUZE/BANDAGES/DRESSINGS) IMPLANT
CANISTER SUCT 3000ML PPV (MISCELLANEOUS) ×1 IMPLANT
CHLORAPREP W/TINT 26 (MISCELLANEOUS) ×1 IMPLANT
CLIP APPLIE 9.375 MED OPEN (MISCELLANEOUS) ×1 IMPLANT
COVER PROBE W GEL 5X96 (DRAPES) ×2 IMPLANT
COVER SURGICAL LIGHT HANDLE (MISCELLANEOUS) ×1 IMPLANT
DERMABOND ADVANCED .7 DNX12 (GAUZE/BANDAGES/DRESSINGS) ×1 IMPLANT
DEVICE DUBIN SPECIMEN MAMMOGRA (MISCELLANEOUS) ×1 IMPLANT
DRAPE CHEST BREAST 15X10 FENES (DRAPES) ×1 IMPLANT
ELECT CAUTERY BLADE 6.4 (BLADE) ×1 IMPLANT
ELECT REM PT RETURN 9FT ADLT (ELECTROSURGICAL) ×1
ELECTRODE REM PT RTRN 9FT ADLT (ELECTROSURGICAL) ×1 IMPLANT
GLOVE SURG SIGNA 7.5 PF LTX (GLOVE) ×1 IMPLANT
GOWN STRL REUS W/ TWL LRG LVL3 (GOWN DISPOSABLE) ×1 IMPLANT
GOWN STRL REUS W/ TWL XL LVL3 (GOWN DISPOSABLE) ×1 IMPLANT
GOWN STRL REUS W/TWL LRG LVL3 (GOWN DISPOSABLE) ×2
GOWN STRL REUS W/TWL XL LVL3 (GOWN DISPOSABLE) ×1
KIT BASIN OR (CUSTOM PROCEDURE TRAY) ×1 IMPLANT
KIT MARKER MARGIN INK (KITS) ×1 IMPLANT
NDL 18GX1X1/2 (RX/OR ONLY) (NEEDLE) IMPLANT
NDL FILTER BLUNT 18X1 1/2 (NEEDLE) IMPLANT
NDL HYPO 25GX1X1/2 BEV (NEEDLE) ×1 IMPLANT
NEEDLE 18GX1X1/2 (RX/OR ONLY) (NEEDLE) ×1 IMPLANT
NEEDLE FILTER BLUNT 18X1 1/2 (NEEDLE) IMPLANT
NEEDLE HYPO 25GX1X1/2 BEV (NEEDLE) ×1 IMPLANT
NS IRRIG 1000ML POUR BTL (IV SOLUTION) ×1 IMPLANT
PACK GENERAL/GYN (CUSTOM PROCEDURE TRAY) ×1 IMPLANT
SUT MNCRL AB 4-0 PS2 18 (SUTURE) ×1 IMPLANT
SUT VIC AB 3-0 SH 18 (SUTURE) ×1 IMPLANT
SYR CONTROL 10ML LL (SYRINGE) ×1 IMPLANT
TOWEL GREEN STERILE (TOWEL DISPOSABLE) ×1 IMPLANT
TOWEL GREEN STERILE FF (TOWEL DISPOSABLE) ×1 IMPLANT

## 2022-08-30 NOTE — Anesthesia Preprocedure Evaluation (Addendum)
Anesthesia Evaluation  Patient identified by MRN, date of birth, ID band Patient awake    Reviewed: Allergy & Precautions, H&P , NPO status , Patient's Chart, lab work & pertinent test results  Airway Mallampati: II  TM Distance: >3 FB Neck ROM: Full    Dental no notable dental hx.    Pulmonary PE (april 2020, s/p xarelto x 90mo   Pulmonary exam normal breath sounds clear to auscultation       Cardiovascular negative cardio ROS Normal cardiovascular exam Rhythm:Regular Rate:Normal     Neuro/Psych negative neurological ROS  negative psych ROS   GI/Hepatic negative GI ROS, Neg liver ROS,,,  Endo/Other  negative endocrine ROS    Renal/GU negative Renal ROS  negative genitourinary   Musculoskeletal negative musculoskeletal ROS (+)    Abdominal   Peds negative pediatric ROS (+)  Hematology  (+) Blood dyscrasia, anemia Hb 11.5, plt 352   Anesthesia Other Findings L breast ca   Reproductive/Obstetrics negative OB ROS                             Anesthesia Physical Anesthesia Plan  ASA: 2  Anesthesia Plan: General and Regional   Post-op Pain Management: Tylenol PO (pre-op)*, Toradol IV (intra-op)* and Regional block*   Induction: Intravenous  PONV Risk Score and Plan: 3 and Ondansetron, Dexamethasone, Midazolam and Treatment may vary due to age or medical condition  Airway Management Planned: LMA  Additional Equipment: None  Intra-op Plan:   Post-operative Plan: Extubation in OR  Informed Consent: I have reviewed the patients History and Physical, chart, labs and discussed the procedure including the risks, benefits and alternatives for the proposed anesthesia with the patient or authorized representative who has indicated his/her understanding and acceptance.     Dental advisory given  Plan Discussed with: CRNA  Anesthesia Plan Comments:        Anesthesia Quick  Evaluation

## 2022-08-30 NOTE — Anesthesia Postprocedure Evaluation (Signed)
Anesthesia Post Note  Patient: Tanya Rangel  Procedure(s) Performed: LEFT BREAST LUMPECTOMY WITH RADIOACTIVE SEED X2 AND SENTINEL LYMPH NODE BIOPSY (Left: Breast)     Patient location during evaluation: PACU Anesthesia Type: Regional and General Level of consciousness: awake and alert, oriented and patient cooperative Pain management: pain level controlled Vital Signs Assessment: post-procedure vital signs reviewed and stable Respiratory status: spontaneous breathing, nonlabored ventilation and respiratory function stable Cardiovascular status: blood pressure returned to baseline and stable Postop Assessment: no apparent nausea or vomiting Anesthetic complications: no   No notable events documented.  Last Vitals:  Vitals:   08/30/22 0845 08/30/22 0900  BP: 123/79   Pulse: 91 74  Resp: (!) 21 13  Temp: 36.4 C   SpO2: 95% 100%    Last Pain:  Vitals:   08/30/22 0900  TempSrc:   PainSc: [place]

## 2022-08-30 NOTE — Op Note (Signed)
Charniece D Mcguffin 08/30/2022   Pre-op Diagnosis: LEFT BREAST CANCER x 2     Post-op Diagnosis: same  Procedure(s): LEFT BREAST LUMPECTOMY WITH RADIOACTIVE SEED X2  DEEP LEFT AXILLARY SENTINEL LYMPH NODE BIOPSY INJECTION OF MAG TRACE FOR LYMPH NODE MAPPING  Surgeon(s): Coralie Keens, MD  Anesthesia: General  Staff:  Circulator: Darylene Price, RN Scrub Person: Arnoldo Morale, RN; Amedeo Plenty, Amy E  Estimated Blood Loss: Minimal               Specimens: sent to path  Indications: This is a 42 year old female who is presenting with a left breast cancer x 2.  She was found to have calcifications deep in the upper outer quadrant of the left breast as well as a more superficial mass in the left breast.  Biopsy of both areas showed invasive ductal carcinoma and DCIS.  The decision was made to proceed with 2 separate lumpectomies and sentinel node biopsy of the left breast after discussion with the patient and her family  Findings: I removed the more superficial left breast cancer with a lumpectomy and marked all margins.  Anterior margin is the skin in the posterior margin is the more deeper lumpectomy.  I removed the deeper lumpectomy separately.  The anterior margin of this lumpectomy is the posterior margin of the first lumpectomy and the posterior margin of the chest wall.  Procedure: The patient was brought to the operating room and identified as correct patient.  She was placed upon the operating table and general anesthesia was induced.  I injected Mag trace underneath the left nipple areolar complex and massaged the breast.  Her left breast and axilla were then prepped and draped in usual sterile fashion.  Using the neoprobe I located the radioactive seeds at approximately the 3 o'clock position of the left breast.  A more superficial breast cancer was palpable.  I anesthetized the skin and made incision longitudinally over this area with a scalpel.  I then dissected down to the  breast tissue with electrocautery.  I then dissected widely in all directions around the more superficial palpable cancer.  I then came underneath the cancer and breast tissue and completed lumpectomy with the cautery.  I marked all margins with paint.  An x-ray was performed of the specimen confirming that the radioactive seed and previous biopsy clip were in the center of the specimen.  Identified the second radioactive seed which was posterior and slightly lateral to the first seed.  This was near the chest wall.  With the aid of the neoprobe I dissected around this area as well.  I then dressed incision with an Allis clamp and the radioactive seed came out of the specimen.  This was placed in a cup and x-rayed separately.  I completed lumpectomy going all the way down to the chest wall and pectoralis muscle and dissected widely in all directions.  I again marked this lumpectomy specimen which was labeled as the deeper cancer with marker paint.  An x-ray was performed on the specimen confirming that the previous biopsy clip was in the specimen as well.  This was then sent to pathology for evaluation. Using the mag trace probe, identified an area of increased uptake in the left axilla.  I anesthetized skin with Marcaine and made incision with a scalpel.  I then dissected down into the deep left axillary tissue.  Identified at least 3 sentinel lymph nodes which were visible as well and using the cautery  and surgical clips excised the lymph nodes and some of the surrounding fatty tissue.  No further uptake in the axilla was identified.  Hemostasis appeared to be achieved.  I next anesthetized incisions further with Marcaine.  I placed clips around the lumpectomy cavity for marking purposes.  I then closed both incisions with interrupted 3-0 Vicryl sutures and running 4-0 Monocryl sutures.  Dermabond was then applied.  The patient tolerated the procedure well.  All the counts were correct at the end of the  procedure.  She was then placed in a breast binder and then extubated in the operating room and taken in stable condition to the recovery room.          Coralie Keens   Date: 08/30/2022  Time: 8:38 AM

## 2022-08-30 NOTE — Anesthesia Procedure Notes (Signed)
Procedure Name: LMA Insertion Date/Time: 08/30/2022 7:29 AM  Performed by: Dorthea Cove, CRNAPre-anesthesia Checklist: Patient identified, Emergency Drugs available, Suction available and Patient being monitored Patient Re-evaluated:Patient Re-evaluated prior to induction Oxygen Delivery Method: Circle System Utilized Preoxygenation: Pre-oxygenation with 100% oxygen Induction Type: IV induction Ventilation: Mask ventilation without difficulty LMA: LMA inserted LMA Size: 4.0 Number of attempts: 1 Airway Equipment and Method: Bite block Placement Confirmation: positive ETCO2 Tube secured with: Tape Dental Injury: Teeth and Oropharynx as per pre-operative assessment

## 2022-08-30 NOTE — Transfer of Care (Signed)
Immediate Anesthesia Transfer of Care Note  Patient: Tanya Rangel  Procedure(s) Performed: LEFT BREAST LUMPECTOMY WITH RADIOACTIVE SEED X2 AND SENTINEL LYMPH NODE BIOPSY (Left: Breast)  Patient Location: PACU  Anesthesia Type:General  Level of Consciousness: awake, drowsy, and patient cooperative  Airway & Oxygen Therapy: Patient Spontanous Breathing  Post-op Assessment: Report given to RN and Post -op Vital signs reviewed and stable  Post vital signs: Reviewed and stable  Last Vitals:  Vitals Value Taken Time  BP 123/79 08/30/22 0845  Temp    Pulse 89 08/30/22 0847  Resp 16 08/30/22 0847  SpO2 100 % 08/30/22 0847  Vitals shown include unvalidated device data.  Last Pain:  Vitals:   08/30/22 0556  TempSrc:   PainSc: 0-No pain         Complications: No notable events documented.

## 2022-08-30 NOTE — Progress Notes (Signed)
HPI:   Tanya Rangel was previously seen in the [place] clinic due to a personal history of cancer and concerns regarding a hereditary predisposition to cancer. Please refer to our prior cancer genetics clinic note for more information regarding our discussion, assessment and recommendations, at the time. Tanya Rangel recent genetic test results were disclosed to her, as were recommendations warranted by these results. These results and recommendations are discussed in more detail below.  CANCER HISTORY:  Oncology History  Primary malignant neoplasm of upper outer quadrant of left breast ([place])  07/25/2022 Imaging   Mammo/US IMPRESSION: 1. Irregular 1.2 cm mass in left breast at 2 o'clock, 5 cm the nipple, corresponding to the palpable lump, highly suspicious breast malignancy. 2. Small group calcifications in the posterior, lateral left breast, without change compared to prior exams dating back to 0/24/2022. Given the stability, these are likely to be benign calcifications, but have some increased suspicion due to adjacent palpable.   08/02/2022 Initial Biopsy   1. Breast, left, needle core biopsy, 2 o'clock, 5cmfn (ribbon) INVASIVE MODERATELY DIFFERENTIATED DUCTAL ADENOCARCINOMA, GRADE 2 (3+2+1) FOCAL DUCTAL CARCINOMA IN SITU, INTERMEDIATE NUCLEAR GRADE, SOLID TYPE WITHOUT NECROSIS TUBULE FORMATION: SCORE 3 NUCLEAR PLEOMORPHISM: SCORE 2 MITOTIC COUNT: SCORE 1 TOTAL SCORE: 6 OF 9 OVERALL GRADE: GRADE 2 NEGATIVE FOR LYMPHOVASCULAR INVASION NEGATIVE FOR MICROCALCIFICATIONS INVASIVE TUMOR MEASURES 6 MM IN GREATEST LINEAR EXTENT  1. Breast, left, needle core biopsy, 2 o'clock, 5 cmfn (ribbon) PROGNOSTIC INDICATORS  The tumor cells are equivocal for Her2 (2+). Her2 by FISH will be performed and the results reported separately. Estrogen Receptor: 95%, POSITIVE, MODERATE-STRONG STAINING INTENSITY Progesterone Receptor: 50%, POSITIVE, MODERATE-STRONG STAINING  INTENSITY Proliferation Marker Ki67: 30%  1. Breast,left, needle core biopsy,2.o'clock,5 cmfn (ribbon) FLUORESCENCE IN-SITU HYBRIDIZATION Results: GROUP 5: HER2 **NEGATIVE**  2. Breast, left, needle core biopsy, UOQ (coil) INVASIVE MODERATELY DIFFERENTIATED DUCTAL ADENOCARCINOMA, GRADE 2 (3+2+1) FOCAL DUCTAL CARCINOMA IN SITU, INTERMEDIATE NUCLEAR GRADE NECROSIS TUBULE FORMATION: SCORE 3 NUCLEAR PLEOMORPHISM: SCORE 2 MITOTIC COUNT: SCORE 1 TOTAL SCORE: 6 OF 9 OVERALL GRADE: GRADE 2 MICROCALCIFICATIONS PRESENT WITHIN PERITUMORAL STROMA, DCIS AND COLUMNAR CELL CHANGE NEGATIVE FOR ANGIOLYMPHATIC INVASION BACKGROUND FIBROCYSTIC CHANGES INCLUDING STROMAL FIBROSIS, CYSTIC DILATATION OF DUCTS, APOCRINE METAPLASIA AND COLUMNAR CELL CHANGE TUMOR MEASURES 1.25 MM IN GREATEST LINEAR EXTENT  2. Breast, left, needle core biopsy, UOQ (coil) PROGNOSTIC INDICATORS  The tumor cells are equivocal for Her2 (2+). Her2 by FISH will be performed and the results reported separately. Estrogen Receptor: 99%, POSITIVE, STRONG STAINING INTENSITY Progesterone Receptor: 30%, POSITIVE, WEAK-MODERATE STAINING INTENSITY Proliferation Marker Ki67: 10%  2. Breast, left, needle core biopsy, UOQ(coil) FLUORESCENCE IN-SITU HYBRIDIZATION Results: GROUP 5: HER2 **NEGATIVE**     08/15/2022 Initial Diagnosis   Primary malignant neoplasm of upper outer quadrant of left breast ([place])   08/15/2022 Cancer Staging   Staging form: Breast, AJCC 8th Edition - Clinical stage from 08/15/2022: Stage IA (cT1c, cN0, cM0, G2, ER+, PR+, HER2-) - Signed by Hayden Pedro, PA-C on 08/15/2022 Stage prefix: Initial diagnosis Method of lymph node assessment: Clinical Histologic grading system: 3 grade system    Genetic Testing   [facility] Custom Panel+RNA was Negative. Report date is 08/24/2022.  The Custom Hereditary Cancers Panel offered by [facility] includes sequencing and/or deletion duplication testing of the following 43  genes: APC, ATM, AXIN2, BAP1, BARD1, BMPR1A, BRCA1, BRCA2, BRIP1, CDH1, CDK4, CDKN2A (p14ARF and p16INK4a only), CHEK2, CTNNA1, EPCAM (Deletion/duplication testing only), FH, GREM1 (promoter region duplication testing only), HOXB13, KIT, MBD4,  MEN1, MLH1, MSH2, MSH3, MSH6, MUTYH, NF1, NHTL1, PALB2, PDGFRA, PMS2, POLD1, POLE, PTEN, RAD51C, RAD51D, SMAD4, SMARCA4. STK11, TP53, TSC1, TSC2, and VHL.      FAMILY HISTORY:  We obtained a detailed, 4-generation family history.  Significant diagnoses are listed below:   Tanya Rangel is unaware of previous family history of cancer and/or genetic testing for hereditary cancer risks. There is no reported Ashkenazi Jewish ancestry.        GENETIC TEST RESULTS:  The [facility] Custom Cancer Panel found no pathogenic mutations.   The Custom Hereditary Cancers Panel offered by [facility] includes sequencing and/or deletion duplication testing of the following 43 genes: APC, ATM, AXIN2, BAP1, BARD1, BMPR1A, BRCA1, BRCA2, BRIP1, CDH1, CDK4, CDKN2A (p14ARF and p16INK4a only), CHEK2, CTNNA1, EPCAM (Deletion/duplication testing only), FH, GREM1 (promoter region duplication testing only), HOXB13, KIT, MBD4, MEN1, MLH1, MSH2, MSH3, MSH6, MUTYH, NF1, NHTL1, PALB2, PDGFRA, PMS2, POLD1, POLE, PTEN, RAD51C, RAD51D, SMAD4, SMARCA4. STK11, TP53, TSC1, TSC2, and VHL.   The test report has been scanned into EPIC and is located under the Molecular Pathology section of the Results Review tab.  A portion of the result report is included below for reference. Genetic testing reported out on 08/24/2022.     Even though a pathogenic variant was not identified, possible explanations for her personal history of cancer may include: There may be no hereditary risk for cancer in the family. Tanya Rangel personal history of cancer may be due to other genetic or environmental factors. There may be a gene mutation in one of these genes that current testing methods cannot detect, but that chance  is small. There could be another gene that has not yet been discovered, or that we have not yet tested, that is responsible for the cancer diagnoses in the family.   Therefore, it is important to remain in touch with cancer genetics in the future so that we can continue to offer Tanya Rangel the most up to date genetic testing.   ADDITIONAL GENETIC TESTING:  We discussed with Tanya Rangel that her genetic testing was fairly extensive.  If there are genes identified to increase cancer risk that can be analyzed in the future, we would be happy to discuss and coordinate this testing at that time.    CANCER SCREENING RECOMMENDATIONS:  Tanya Rangel test result is considered negative (normal).  This means that we have not identified a hereditary cause for her personal history of cancer at this time. Most cancers happen by chance and this negative test suggests that her cancer may fall into this category.    An individual's cancer risk and medical management are not determined by genetic test results alone. Overall cancer risk assessment incorporates additional factors, including personal medical history, family history, and any available genetic information that may result in a personalized plan for cancer prevention and surveillance. Therefore, it is recommended she continue to follow the cancer management and screening guidelines provided by her oncology and primary healthcare provider.  RECOMMENDATIONS FOR FAMILY MEMBERS:   Since she did not inherit a mutation in a cancer predisposition gene included on this panel, her son could not have inherited a mutation from her in one of these genes. Individuals in this family might be at some increased risk of developing cancer, over the general population risk, due to the family history of cancer. We recommend women in this family have a yearly mammogram beginning at age 65, or 13 years younger than the earliest onset of cancer, an annual  clinical breast exam,  and perform monthly breast self-exams.  FOLLOW-UP:  Cancer genetics is a rapidly advancing field and it is possible that new genetic tests will be appropriate for her and/or her family members in the future. We encouraged her to remain in contact with cancer genetics on an annual basis so we can update her personal and family histories and let her know of advances in cancer genetics that may benefit this family.   Our contact number was provided. Tanya Rangel questions were answered to her satisfaction, and she knows she is welcome to call us at anytime with additional questions or concerns.   Lucille Passy, MS, [facility] Genetic Counselor [email or web address] (P) [phone]

## 2022-08-30 NOTE — Interval H&P Note (Signed)
History and Physical Interval Note:no change in H and P  08/30/2022 6:32 AM  Tanya Rangel  has presented today for surgery, with the diagnosis of LEFT BREAST CANCER.  The various methods of treatment have been discussed with the patient and family. After consideration of risks, benefits and other options for treatment, the patient has consented to  Procedure(s): LEFT BREAST LUMPECTOMY WITH RADIOACTIVE SEED X2 AND SENTINEL LYMPH NODE BIOPSY (Left) as a surgical intervention.  The patient's history has been reviewed, patient examined, no change in status, stable for surgery.  I have reviewed the patient's chart and labs.  Questions were answered to the patient's satisfaction.     Coralie Keens

## 2022-08-30 NOTE — Anesthesia Procedure Notes (Signed)
Anesthesia Regional Block: Pectoralis block   Pre-Anesthetic Checklist: , timeout performed,  Correct Patient, Correct Site, Correct Laterality,  Correct Procedure, Correct Position, site marked,  Risks and benefits discussed,  Surgical consent,  Pre-op evaluation,  At surgeon's request and post-op pain management  Laterality: Left  Prep: Maximum Sterile Barrier Precautions used, chloraprep       Needles:  Injection technique: Single-shot  Needle Type: Echogenic Stimulator Needle     Needle Length: 9cm  Needle Gauge: 22     Additional Needles:   Procedures:,,,, ultrasound used (permanent image in chart),,    Narrative:  Start time: 08/30/2022 7:10 AM End time: 08/30/2022 7:15 AM Injection made incrementally with aspirations every 5 mL.  Performed by: Personally  Anesthesiologist: Pervis Hocking, DO  Additional Notes: Monitors applied. No increased pain on injection. No increased resistance to injection. Injection made in 5cc increments. Good needle visualization. Patient tolerated procedure well.

## 2022-08-30 NOTE — Discharge Instructions (Signed)
[place] Office Phone Number [phone]  BREAST BIOPSY/ PARTIAL MASTECTOMY: POST OP INSTRUCTIONS  Always review your discharge instruction sheet given to you by the facility where your surgery was performed.  IF YOU HAVE DISABILITY OR FAMILY LEAVE FORMS, YOU MUST BRING THEM TO THE OFFICE FOR PROCESSING.  DO NOT GIVE THEM TO YOUR DOCTOR.  A prescription for pain medication may be given to you upon discharge.  Take your pain medication as prescribed, if needed.  If narcotic pain medicine is not needed, then you may take acetaminophen (Tylenol) or ibuprofen (Advil) as needed. Take your usually prescribed medications unless otherwise directed If you need a refill on your pain medication, please contact your pharmacy.  They will contact our office to request authorization.  Prescriptions will not be filled after 5pm or on week-ends. You should eat very light the first 24 hours after surgery, such as soup, crackers, pudding, etc.  Resume your normal diet the day after surgery. Most patients will experience some swelling and bruising in the breast.  Ice packs and a good support bra will help.  Swelling and bruising can take several days to resolve.  It is common to experience some constipation if taking pain medication after surgery.  Increasing fluid intake and taking a stool softener will usually help or prevent this problem from occurring.  A mild laxative (Milk of Magnesia or Miralax) should be taken according to package directions if there are no bowel movements after 48 hours. Unless discharge instructions indicate otherwise, you may remove your bandages 24-48 hours after surgery, and you may shower at that time.  You may have steri-strips (small skin tapes) in place directly over the incision.  These strips should be left on the skin for 7-10 days.  If your surgeon used skin glue on the incision, you may shower in 24 hours.  The glue will flake off over the next 2-3 weeks.  Any  sutures or staples will be removed at the office during your follow-up visit. ACTIVITIES:  You may resume regular daily activities (gradually increasing) beginning the next day.  Wearing a good support bra or sports bra minimizes pain and swelling.  You may have sexual intercourse when it is comfortable. You may drive when you no longer are taking prescription pain medication, you can comfortably wear a seatbelt, and you can safely maneuver your car and apply brakes. RETURN TO WORK:  ______________________________________________________________________________________ Tanya Rangel should see your doctor in the office for a follow-up appointment approximately two weeks after your surgery.  Your doctor's nurse will typically make your follow-up appointment when she calls you with your pathology report.  Expect your pathology report 2-3 business days after your surgery.  You may call to check if you do not hear from [place] after three days. OTHER INSTRUCTIONS: YOU MAY REMOVE THE BINDER AND SHOWER STARTING TOMORROW AND THEN PLACE THE BINDER BACK ON ICE PACK, TYLENOL, AND IBUPROFEN ALSO FOR PAIN NO VIGOROUS ACTIVITY FOR ONE WEEK _______________________________________________________________________________________________ _____________________________________________________________________________________________________________________________________ _____________________________________________________________________________________________________________________________________ _____________________________________________________________________________________________________________________________________  WHEN TO CALL YOUR DOCTOR: Fever over 101.0 Nausea and/or vomiting. Extreme swelling or bruising. Continued bleeding from incision. Increased pain, redness, or drainage from the incision.  The clinic staff is available to answer your questions during regular business hours.  Please don't hesitate to  call and ask to speak to one of the nurses for clinical concerns.  If you have a medical emergency, go to the nearest emergency room or call 911.  A surgeon from [facility] is always  on call at the hospital.  For further questions, please visit [email or web address]

## 2022-08-31 ENCOUNTER — Encounter (HOSPITAL_COMMUNITY): Payer: Self-pay | Admitting: Surgery

## 2022-09-04 LAB — SURGICAL PATHOLOGY

## 2022-09-06 ENCOUNTER — Encounter: Payer: Self-pay | Admitting: *Deleted

## 2022-09-20 ENCOUNTER — Telehealth: Payer: Self-pay | Admitting: *Deleted

## 2022-09-20 ENCOUNTER — Telehealth: Payer: Self-pay | Admitting: Radiation Oncology

## 2022-09-20 ENCOUNTER — Encounter: Payer: Self-pay | Admitting: *Deleted

## 2022-09-20 DIAGNOSIS — C50412 Malignant neoplasm of upper-outer quadrant of left female breast: Secondary | ICD-10-CM

## 2022-09-20 NOTE — Telephone Encounter (Signed)
Received oncotype results of 13/4%.  Patient is aware  Referral placed for Dr. Moody. 

## 2022-09-20 NOTE — Telephone Encounter (Signed)
Called pt to schedule FUN/SIM for XRT. Pt was confused stating she had not received a call back from Dr. Ernestina Penna team yet. I asked if she wanted me to transfer her to [facility] to speak with someone. [place]. Pt asked for me to c/b Monday 4/1 to schedule FUN/SIM. Pt was advised this may cause delay in how soon we can schedule her. Pt replied that she was fine with a delay. Will call pt Monday 4/1.

## 2022-09-21 ENCOUNTER — Encounter (HOSPITAL_COMMUNITY): Payer: Self-pay

## 2022-09-24 ENCOUNTER — Telehealth: Payer: Self-pay | Admitting: Radiation Oncology

## 2022-09-24 DIAGNOSIS — R87629 Unspecified abnormal cytological findings in specimens from vagina: Secondary | ICD-10-CM

## 2022-09-24 HISTORY — DX: Unspecified abnormal cytological findings in specimens from vagina: R87.629

## 2022-09-24 NOTE — Telephone Encounter (Signed)
Spoke to pt to schedule FUN and SIM. Pt agreed to both times for upcoming appts. Provider and nurse made aware.

## 2022-09-25 ENCOUNTER — Ambulatory Visit
Admission: RE | Admit: 2022-09-25 | Discharge: 2022-09-25 | Disposition: A | Discharge status: Home / Self Care | Payer: BC Managed Care – PPO | Source: Ambulatory Visit | Attending: Radiation Oncology | Admitting: Radiation Oncology

## 2022-09-25 ENCOUNTER — Encounter: Payer: Self-pay | Admitting: Radiation Oncology

## 2022-09-25 ENCOUNTER — Other Ambulatory Visit: Payer: Self-pay

## 2022-09-25 DIAGNOSIS — Z170 Estrogen receptor positive status [ER+]: Secondary | ICD-10-CM | POA: Diagnosis not present

## 2022-09-25 DIAGNOSIS — Z510 Encounter for antineoplastic radiation therapy: Secondary | ICD-10-CM | POA: Diagnosis present

## 2022-09-25 DIAGNOSIS — C50412 Malignant neoplasm of upper-outer quadrant of left female breast: Secondary | ICD-10-CM | POA: Diagnosis present

## 2022-09-25 NOTE — Progress Notes (Addendum)
Telephone nursing interview for Primary malignant neoplasm of upper outer quadrant of left breast.  Patient unavailable for call. Identity of spouse verified as Mr. Momo Richart, who reports patient doing well. No other issues conveyed at this time.  Meaningful use complete. NO chances of pregnancy.  LMP 09/03/2022   This concludes the interview.   Leandra Kern, LPN

## 2022-09-25 NOTE — Progress Notes (Signed)
Radiation Oncology         [phone]) [phone] ________________________________  Outpatient Follow Up - Conducted via telephone at patient request.  I spoke with the patient to conduct this visit via telephone. The patient was notified in advance and was offered an in person or telemedicine meeting to allow for face to face communication but instead preferred to proceed with a telephone visit.  Name: Tanya Rangel        MRN: [id number]  Date of Service: 09/25/2022 DOB: 07-25-1980  [id number], Margaretha Sheffield, MD  Truitt Merle, MD     REFERRING PHYSICIAN: Truitt Merle, MD   DIAGNOSIS: The encounter diagnosis was Primary malignant neoplasm of upper outer quadrant of left breast.   HISTORY OF PRESENT ILLNESS: Tanya Rangel is a 42 y.o. female with a diagnosis of left breast cancer. The patient was noted to have a palpable mass in the left breast. Diagnostic imaging  on 07/25/22 showed a mass and calcifications in the upper outer left breast. By ultrasound the left breast mass measured 1.2 cm in the 2:00 position. Her axilla was negative for adenopathy. Biopsies on 08/02/22 were performed by ultrasound for the mass and stereotactically for the calcifications. The specimen in the 2:00 position showed a grade 2, invasive ductal carcinoma with associated intermediate grade DCIS. The UOQ specimen showed a grade 2 invasive ductal carcinoma with focal intermediate grade DCIS with microcalcifications. The invasive component was ER/PR positive, HER2 negative with a Ki 67 of 30%, and the DCIS was ER/PR positive.   Since her last visit she underwent a left lumpectomy and sentinel node biopsy on 08/30/22 with Dr. Ninfa Linden. Final pathology showed a grade 3 invasive ductal carcinoma measuring 1.8 cm with associated high-grade DCIS.  The cauterized anterior margin was focally positive for carcinoma.  Mild fibrocystic change was also seen in the left deep lumpectomy site, and 5 sentinel lymph nodes were negative for metastasis.  Dr.  Ninfa Linden has reviewed her pathology and her anterior margin is the skin so no further surgery is recommended. She had Oncotype DX score performed and results were 13 so no systemic chemotherapy is recommended.  She is contacted to discuss adjuvant radiotherapy.   PREVIOUS RADIATION THERAPY: No   PAST MEDICAL HISTORY:  Past Medical History:  Diagnosis Date   Anemia    Breast cancer 08/02/2022   Hx of abnormal cervical Pap smear    PE (pulmonary thromboembolism)    April 2020- was on Xarelto for 6 months       PAST SURGICAL HISTORY: Past Surgical History:  Procedure Laterality Date   BREAST BIOPSY Left 08/02/2022   [place] LT BREAST BX W LOC DEV 1ST LESION IMG BX SPEC US GUIDE 08/02/2022 [facility]   BREAST BIOPSY Left 08/02/2022   MM LT BREAST BX W LOC DEV 1ST LESION IMAGE BX SPEC STEREO GUIDE 08/02/2022 [facility]   BREAST BIOPSY  08/29/2022   MM LT RADIOACTIVE SEED LOC MAMMO GUIDE 08/29/2022 [facility]   BREAST BIOPSY  08/29/2022   MM LT RADIOACTIVE SEED EA ADD LESION LOC MAMMO GUIDE 08/29/2022 [facility]   BREAST LUMPECTOMY WITH RADIOACTIVE SEED AND SENTINEL LYMPH NODE BIOPSY Left 08/30/2022   Procedure: LEFT BREAST LUMPECTOMY WITH RADIOACTIVE SEED X2 AND SENTINEL LYMPH NODE BIOPSY;  Surgeon: Coralie Keens, MD;  Location: [facility];  Service: General;  Laterality: Left;   CESAREAN SECTION  11/13/2007   COLPOSCOPY       FAMILY HISTORY:  Family History  Problem Relation Age of  Onset   Thrombosis Neg Hx      SOCIAL HISTORY:  reports that she has never smoked. She has never used smokeless tobacco. She reports that she does not drink alcohol and does not use drugs. The patient is married and lives in [place]. 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 She's originally from [place]. She has a teenage son and is active in his activities as well.    ALLERGIES: Patient has no known allergies.   MEDICATIONS:  Current  Outpatient Medications  Medication Sig Dispense Refill   calcium carbonate (SUPER CALCIUM) 1500 (600 Ca) MG TABS tablet Take 600 mg of elemental calcium by mouth daily.     cholecalciferol (VITAMIN D3) 25 MCG (1000 UNIT) tablet Take 1,000 Units by mouth daily.     ferrous sulfate 325 (65 FE) MG tablet TAKE 1 TABLET BY MOUTH EVERY DAY WITH BREAKFAST 30 tablet 3   Magnesium 400 MG CAPS Take 400 mg by mouth daily.     traMADol (ULTRAM) 50 MG tablet Take 1 tablet (50 mg total) by mouth every 6 (six) hours as needed for moderate pain or severe pain. 25 tablet 0   No current facility-administered medications for this encounter.     REVIEW OF SYSTEMS: On review of systems, the patient reports she is doing well and is back in clinic seeing patients since surgery. No breast specific complaints are verbalized. She reports a negative home pregnancy test yesterday.      PHYSICAL EXAM:  Unable to assess due to encounter type.   ECOG = 0  0 - Asymptomatic (Fully active, able to carry on all predisease activities without restriction)  1 - Symptomatic but completely ambulatory (Restricted in physically strenuous activity but ambulatory and able to carry out work of a light or sedentary nature. For example, light housework, office work)  2 - Symptomatic, <50% in bed during the day (Ambulatory and capable of all self care but unable to carry out any work activities. Up and about more than 50% of waking hours)  3 - Symptomatic, >50% in bed, but not bedbound (Capable of only limited self-care, confined to bed or chair 50% or more of waking hours)  4 - Bedbound (Completely disabled. Cannot carry on any self-care. Totally confined to bed or chair)  5 - Death   Eustace Pen MM, Creech RH, Tormey DC, et al. [phone]). "Toxicity and response criteria of the [facility]". [place] Oncol. 5 (6): 649-55    LABORATORY DATA:  Lab Results  Component Value Date   WBC 3.6 (L) 08/16/2022    HGB 11.5 (L) 08/16/2022   HCT 36.0 08/16/2022   MCV 80.7 08/16/2022   PLT 352 08/16/2022   Lab Results  Component Value Date   NA 139 08/16/2022   K 4.3 08/16/2022   CL 103 08/16/2022   CO2 27 08/16/2022   Lab Results  Component Value Date   ALT 20 08/16/2022   AST 25 08/16/2022   ALKPHOS 54 08/16/2022   BILITOT 0.4 08/16/2022      RADIOGRAPHY: MM Breast Surgical Specimen  Result Date: 08/30/2022 CLINICAL DATA:  Lumpectomy for 2 sites of invasive ductal carcinoma and DCIS in the left breast localized with radioactive seeds. One site is marked with a ribbon shaped biopsy marker clip. The coil shaped clip for the 2nd site migrated 2 cm and residual calcifications were localized instead. EXAM: SPECIMEN RADIOGRAPH OF THE LEFT BREAST X  2 COMPARISON:  Previous exam(s). FINDINGS: Status post excision of the left breast x 2. Two radioactive seeds, ribbon shaped clip and coil shaped clip are present, completely intact. It is difficult to assess for the presence of the residual calcifications with the current technique being used for specimen radiographs. IMPRESSION: Specimen radiographs of the left breast. It is difficult to assess for the presence of the residual calcifications localized for 1 of the sites due to the current technique being used for specimen radiographs. Therefore, a follow-up left diagnostic mammogram is recommended when the patient has recovered enough to tolerate a mammogram to assess for residual calcifications. Electronically Signed   By: Claudie Revering M.D.   On: 08/30/2022 08:39  MM Breast Surgical Specimen  Result Date: 08/30/2022 CLINICAL DATA:  Lumpectomy for 2 sites of invasive ductal carcinoma and DCIS in the left breast localized with radioactive seeds. One site is marked with a ribbon shaped biopsy marker clip. The coil shaped clip for the 2nd site migrated 2 cm and residual calcifications were localized instead. EXAM: SPECIMEN RADIOGRAPH OF THE LEFT BREAST X 2  COMPARISON:  Previous exam(s). FINDINGS: Status post excision of the left breast x 2. Two radioactive seeds, ribbon shaped clip and coil shaped clip are present, completely intact. It is difficult to assess for the presence of the residual calcifications with the current technique being used for specimen radiographs. IMPRESSION: Specimen radiographs of the left breast. It is difficult to assess for the presence of the residual calcifications localized for 1 of the sites due to the current technique being used for specimen radiographs. Therefore, a follow-up left diagnostic mammogram is recommended when the patient has recovered enough to tolerate a mammogram to assess for residual calcifications. Electronically Signed   By: Claudie Revering M.D.   On: 08/30/2022 08:39  MM LT RAD SEED EA ADD LESION LOC MAMMO  Result Date: 08/29/2022 CLINICAL DATA:  42 year old female presenting for seed localization of the left breast. Patient has newly diagnosed left breast invasive ductal carcinoma. EXAM: MAMMOGRAPHIC GUIDED RADIOACTIVE SEED LOCALIZATION OF THE LEFT BREAST x2 COMPARISON:  Previous exam(s). FINDINGS: Patient presents for radioactive seed localization prior to . I met with the patient and we discussed the procedure of seed localization including benefits and alternatives. We discussed the high likelihood of a successful procedure. We discussed the risks of the procedure including infection, bleeding, tissue injury and further surgery. We discussed the low dose of radioactivity involved in the procedure. Informed, written consent was given. The usual time-out protocol was performed immediately prior to the procedure. Using mammographic guidance, sterile technique, 1% lidocaine and an I-125 radioactive seed, the ribbon shaped biopsy marking clip in the left breast was localized using a lateral approach. The follow-up mammogram images confirm the seed in the expected location and were marked for Dr. Ninfa Linden. Follow-up  survey of the patient confirms presence of the radioactive seed. Order number of I-125 seed:  [id number]. Total activity: 0.262 mCi reference Date: August 01, 2022 Using mammographic guidance, sterile technique, 1% lidocaine and an I-125 radioactive seed, the calcifications in the left breast were localized using a lateral approach. The follow-up mammogram images confirm the seed in the expected location and were marked for Dr. Ninfa Linden. Follow-up survey of the patient confirms presence of the radioactive seed. Order number of I-125 seed:  [id number]. Total activity: 0.256 mCi reference Date: August 15, 2022 The patient tolerated the procedure well and was released from the [facility]. She was given instructions regarding seed  removal. IMPRESSION: Radioactive seed localization left breast. No apparent complications. Electronically Signed   By: Audie Pinto M.D.   On: 08/29/2022 14:38  MM LT RADIOACTIVE SEED LOC MAMMO GUIDE  Result Date: 08/29/2022 CLINICAL DATA:  42 year old female presenting for seed localization of the left breast. Patient has newly diagnosed left breast invasive ductal carcinoma. EXAM: MAMMOGRAPHIC GUIDED RADIOACTIVE SEED LOCALIZATION OF THE LEFT BREAST x2 COMPARISON:  Previous exam(s). FINDINGS: Patient presents for radioactive seed localization prior to . I met with the patient and we discussed the procedure of seed localization including benefits and alternatives. We discussed the high likelihood of a successful procedure. We discussed the risks of the procedure including infection, bleeding, tissue injury and further surgery. We discussed the low dose of radioactivity involved in the procedure. Informed, written consent was given. The usual time-out protocol was performed immediately prior to the procedure. Using mammographic guidance, sterile technique, 1% lidocaine and an I-125 radioactive seed, the ribbon shaped biopsy marking clip in the left breast was localized using a  lateral approach. The follow-up mammogram images confirm the seed in the expected location and were marked for Dr. Ninfa Linden. Follow-up survey of the patient confirms presence of the radioactive seed. Order number of I-125 seed:  [id number]. Total activity: 0.262 mCi reference Date: August 01, 2022 Using mammographic guidance, sterile technique, 1% lidocaine and an I-125 radioactive seed, the calcifications in the left breast were localized using a lateral approach. The follow-up mammogram images confirm the seed in the expected location and were marked for Dr. Ninfa Linden. Follow-up survey of the patient confirms presence of the radioactive seed. Order number of I-125 seed:  [id number]. Total activity: 0.256 mCi reference Date: August 15, 2022 The patient tolerated the procedure well and was released from the [facility]. She was given instructions regarding seed removal. IMPRESSION: Radioactive seed localization left breast. No apparent complications. Electronically Signed   By: Audie Pinto M.D.   On: 08/29/2022 14:38      IMPRESSION/PLAN: 1. Stage IA, pT1cN0M0, grade 3, ER/PR positive invasive ductal carcinoma of the left breast. Dr. Lisbeth Renshaw has reviewed her pathology findings and today we reviewed the nature of early stage left breast disease.  She has done well since surgery and does not need systemic chemotherapy.  Dr. Lisbeth Renshaw would recommend external radiotherapy to the breast  to reduce risks of local recurrence followed by antiestrogen therapy. We discussed the risks, benefits, short, and long term effects of radiotherapy, as well as the curative intent, and the patient is interested in proceeding.  We reviewed the delivery and logistics of radiotherapy and given her young age and the long-term data for 6 1/2 weeks of radiotherapy, Dr. Lisbeth Renshaw recommends this regimen.  Again we reviewed that insurance may only cover hypofractionated treatment which we considered to be an acceptable option however would  prefer the longer course given the longer term outcome data on that regimen and her needs to travel during her treatment.  She will come for simulation later today  at which time she will sign written consent to proceed. 2.  Contraceptive Counseling. The patient is aware of the need to avoid pregnancy prior to radiation. She had a negative home pregnancy test yesterday.      This encounter was conducted via telephone.  The patient has provided two factor identification and has given verbal consent for this type of encounter and has been advised to only accept a meeting of this type in a secure network environment. The time spent during  this encounter was 35 minutes including preparation, discussion, and coordination of the patient's care. The attendants for this meeting include  Hayden Pedro  and Toleen D Lomax.  During the encounter, Hayden Pedro was located at [facility] Radiation Oncology Department.  Tanya Rangel was located at home.      Carola Rhine, [facility]    **Disclaimer: This note was dictated with voice recognition software. Similar sounding words can inadvertently be transcribed and this note may contain transcription errors which may not have been corrected upon publication of note.**

## 2022-09-27 ENCOUNTER — Encounter (HOSPITAL_COMMUNITY): Payer: Self-pay

## 2022-09-27 ENCOUNTER — Encounter: Payer: Self-pay | Admitting: *Deleted

## 2022-09-28 ENCOUNTER — Telehealth: Payer: Self-pay | Admitting: Hematology

## 2022-09-28 NOTE — Telephone Encounter (Signed)
Reached out to patient to schedule per 4/5 IB, patient aware of date and time.

## 2022-10-08 ENCOUNTER — Ambulatory Visit: Payer: BC Managed Care – PPO | Attending: Surgery | Admitting: Physical Therapy

## 2022-10-08 ENCOUNTER — Encounter: Payer: Self-pay | Admitting: Physical Therapy

## 2022-10-08 DIAGNOSIS — R293 Abnormal posture: Secondary | ICD-10-CM | POA: Diagnosis present

## 2022-10-08 NOTE — Therapy (Signed)
OUTPATIENT PHYSICAL THERAPY BREAST CANCER POST OP FOLLOW UP   Patient Name: Tanya Rangel MRN: [id number] DOB:[birth year], 42 y.o., female Today's Date: 10/08/2022  END OF SESSION:   Past Medical History:  Diagnosis Date   Anemia    Breast cancer 08/02/2022   Hx of abnormal cervical Pap smear    PE (pulmonary thromboembolism)    April 2020- was on Xarelto for 6 months   Past Surgical History:  Procedure Laterality Date   BREAST BIOPSY Left 08/02/2022   [place] LT BREAST BX W LOC DEV 1ST LESION IMG BX SPEC US GUIDE 08/02/2022 [facility]   BREAST BIOPSY Left 08/02/2022   MM LT BREAST BX W LOC DEV 1ST LESION IMAGE BX SPEC STEREO GUIDE 08/02/2022 [facility]   BREAST BIOPSY  08/29/2022   MM LT RADIOACTIVE SEED LOC MAMMO GUIDE 08/29/2022 [facility]   BREAST BIOPSY  08/29/2022   MM LT RADIOACTIVE SEED EA ADD LESION LOC MAMMO GUIDE 08/29/2022 [facility]   BREAST LUMPECTOMY WITH RADIOACTIVE SEED AND SENTINEL LYMPH NODE BIOPSY Left 08/30/2022   Procedure: LEFT BREAST LUMPECTOMY WITH RADIOACTIVE SEED X2 AND SENTINEL LYMPH NODE BIOPSY;  Surgeon: Abigail Miyamoto, MD;  Location: [facility] OR;  Service: General;  Laterality: Left;   CESAREAN SECTION  11/13/2007   COLPOSCOPY     Patient Active Problem List   Diagnosis Date Noted   Genetic testing 08/27/2022   Iron deficiency anemia due to chronic blood loss 08/16/2022   Primary malignant neoplasm of upper outer quadrant of left breast 08/15/2022    PCP: Maurice Small, MD   REFERRING PROVIDER: Abigail Miyamoto, MD   REFERRING DIAG: [phone] (ICD-10-CM) - Malignant neoplasm of unspecified site of left female breast   THERAPY DIAG:  No diagnosis found.  Rationale for Evaluation and Treatment: Rehabilitation  ONSET DATE: 08/02/22  SUBJECTIVE:                                                                                                                                                                                            SUBJECTIVE STATEMENT: I am not having any issues now.   PERTINENT HISTORY:  Patient was diagnosed on 08/02/22 with left grade 2. She underwent mammograms and ultrasound showing a 1.2 cm mass at the 2 o'clock position of the left breast and deeper in the breast there were calcifications measuring 7 mm. Both areas were biopsied and both showed both invasive ductal carcinoma and ductal carcinoma in situ. The larger area showed 95% ER positivity, 50% PR positive, and a Ki-67 of 30%. It was HER2 negative. The other area showed 99% ER positive, 30% PR positive,  10% Ki-67, and HER2 negative as well. L breast lumpectomy and SLNB (0/5) on 08/30/22  PATIENT GOALS:  Reassess how my recovery is going related to arm function, pain, and swelling.  PAIN:  Are you having pain? No  PRECAUTIONS: Recent Surgery, left UE Lymphedema risk,   ACTIVITY LEVEL / LEISURE: running, cycling   OBJECTIVE:   PATIENT SURVEYS:  QUICK DASH:  Quick Dash - 10/08/22 0001     Open a tight or new jar No difficulty    Do heavy household chores (wash walls, wash floors) No difficulty    Carry a shopping bag or briefcase No difficulty    Wash your back No difficulty    Use a knife to cut food No difficulty    Recreational activities in which you take some force or impact through your arm, shoulder, or hand (golf, hammering, tennis) No difficulty    During the past week, to what extent has your arm, shoulder or hand problem interfered with your normal social activities with family, friends, neighbors, or groups? Not at all    During the past week, to what extent has your arm, shoulder or hand problem limited your work or other regular daily activities Not at all    Arm, shoulder, or hand pain. None    Tingling (pins and needles) in your arm, shoulder, or hand None    Difficulty Sleeping No difficulty    DASH Score 0 %              OBSERVATIONS: Healing lumpectomy and SLNB scars with increased scar tissue present and  small seroma still palpable in axilla with small cord in axilla which pt reports both are improving  POSTURE:  Forward head and rounded shoulders posture   UPPER EXTREMITY AROM/PROM:   A/PROM RIGHT   eval    Shoulder extension 88  Shoulder flexion 180  Shoulder abduction 176  Shoulder internal rotation 58  Shoulder external rotation 110                          (Blank rows = not tested)   A/PROM LEFT   eval LEFT 10/08/22  Shoulder extension 78 71  Shoulder flexion 179 170  Shoulder abduction 179 175  Shoulder internal rotation 74 74  Shoulder external rotation 115 101                          (Blank rows = not tested)   CERVICAL AROM: All within normal limits:      Percent limited  Flexion WFL  Extension WFL  Right lateral flexion WFL  Left lateral flexion WFL  Right rotation WFL  Left rotation WFL      UPPER EXTREMITY STRENGTH: 5/5   LYMPHEDEMA ASSESSMENTS:    LANDMARK RIGHT   eval  10 cm proximal to olecranon process 27.5  Olecranon process 23.1  10 cm proximal to ulnar styloid process 18.7  Just proximal to ulnar styloid process 14.7  Across hand at thumb web space 18  At base of 2nd digit 5.6  (Blank rows = not tested)   LANDMARK LEFT   eval LEFT 10/08/22  10 cm proximal to olecranon process 28 29  Olecranon process 23.4 24  10  cm proximal to ulnar styloid process 18.5 19  Just proximal to ulnar styloid process 14.8 15  Across hand at thumb web space 18.9 17.8  At base of 2nd digit 5.5  5.4  (Blank rows = not tested)  Surgery type/Date: 08/30/22-L breast lumpectomy and SLNB Number of lymph nodes removed: 0/5 Current/past treatment (chemo, radiation, hormone therapy): will begin 10/10/22 followed by hormone therapy  Other symptoms:  Heaviness/tightness No Pain No Pitting edema No Infections No Decreased scar mobility Yes Stemmer sign No  PATIENT EDUCATION:  Education details: beginning UE weights slowly and how to slowly progress to decrease  risk of lymphedema, scar mobilization, end range stretching Person educated: Patient Education method: Explanation, Demonstration, and Handouts Education comprehension: verbalized understanding  HOME EXERCISE PROGRAM: Reviewed previously given post op HEP.   ASSESSMENT:  CLINICAL IMPRESSION: Pt returns to PT after undergoing a L lumpectomy and SLNB on 08/30/22 (0/5). Pt has returned to baseline shoulder ROM and is not having any issues, swelling or pain. She does have a small cord which she reports is improving. Educated pt to continue end range stretching especially during radiation. She also developed a seroma in her axilla which is improving so educated pt on wearing compression if needed and in scar massage. Pt is not interested in ABC class at this time but encouraged her to call us if she decides to take it. Pt will be discharged from skilled PT services at this time.   Pt will benefit from skilled therapeutic intervention to improve on the following deficits: Decreased knowledge of precautions, impaired UE functional use, pain, decreased ROM, postural dysfunction.   PT treatment/interventions: ADL/Self care home management, Therapeutic exercises, Therapeutic activity, Patient/Family education, and Self Care   GOALS: Goals reviewed with patient? Yes  LONG TERM GOALS:  (STG=LTG)  GOALS Name Target Date  Goal status  1 Pt will demonstrate she has regained full shoulder ROM and function post operatively compared to baselines.  Baseline: 10/08/22  MET     PLAN:  PT FREQUENCY/DURATION: d/c this session   PLAN FOR NEXT SESSION: d/c this session and continue L dex screenings every 3 months for 2 years post op   [facility]  [address]  [place] [address]  [phone]  After Breast Cancer Class It is recommended you attend the ABC class to be educated on lymphedema risk reduction. This class is free of charge and lasts for 1 hour. It is a  1-time class. You will need to download the Webex app either on your phone or computer. We will send you a link the night before or the morning of the class. You should be able to click on that link to join the class. This is not a confidential class. You don't have to turn your camera on, but other participants may be able to see your email address.  Scar massage You can begin gentle scar massage to you incision sites. Gently place one hand on the incision and move the skin (without sliding on the skin) in various directions. Do this for a few minutes and then you can gently massage either coconut oil or vitamin E cream into the scars.  Compression garment You should continue wearing your compression bra until you feel like you no longer have swelling.  Home exercise Program Continue doing the exercises you were given until you feel like you can do them without feeling any tightness at the end.   Walking Program Studies show that 30 minutes of walking per day (fast enough to elevate your heart rate) can significantly reduce the risk of a cancer recurrence. If you can't walk due to other medical reasons, we encourage  you to find another activity you could do (like a stationary bike or water exercise).  Posture After breast cancer surgery, people frequently sit with rounded shoulders posture because it puts their incisions on slack and feels better. If you sit like this and scar tissue forms in that position, you can become very tight and have pain sitting or standing with good posture. Try to be aware of your posture and sit and stand up tall to heal properly.  Follow up PT: It is recommended you return every 3 months for the first 3 years following surgery to be assessed on the SOZO machine for an L-Dex score. This helps prevent clinically significant lymphedema in 95% of patients. These follow up screens are 10 minute appointments that you are not billed for.  [facility],  PT 10/08/2022, 7:56 AM   PHYSICAL THERAPY DISCHARGE SUMMARY  Visits from Start of Care: 2  Current functional level related to goals / functional outcomes: All goals met   Remaining deficits: None   Education / Equipment: HEP, lymphedema education   Patient agrees to discharge. Patient goals were met. Patient is being discharged due to meeting the stated rehab goals.  Milagros Loll [place], [place] 10/08/22 8:24 AM

## 2022-10-09 DIAGNOSIS — C50412 Malignant neoplasm of upper-outer quadrant of left female breast: Secondary | ICD-10-CM | POA: Diagnosis not present

## 2022-10-10 ENCOUNTER — Other Ambulatory Visit: Payer: Self-pay

## 2022-10-10 ENCOUNTER — Ambulatory Visit
Admission: RE | Admit: 2022-10-10 | Discharge: 2022-10-10 | Disposition: A | Discharge status: Home / Self Care | Payer: BC Managed Care – PPO | Source: Ambulatory Visit | Attending: Radiation Oncology | Admitting: Radiation Oncology

## 2022-10-10 DIAGNOSIS — C50412 Malignant neoplasm of upper-outer quadrant of left female breast: Secondary | ICD-10-CM | POA: Diagnosis not present

## 2022-10-10 LAB — RAD ONC ARIA SESSION SUMMARY
Course Elapsed Days: 0
Plan Fractions Treated to Date: 1
Plan Prescribed Dose Per Fraction: 2 Gy
Plan Total Fractions Prescribed: 25
Plan Total Prescribed Dose: 50 Gy
Reference Point Dosage Given to Date: 2 Gy
Reference Point Session Dosage Given: 2 Gy
Session Number: 1

## 2022-10-11 ENCOUNTER — Other Ambulatory Visit: Payer: Self-pay

## 2022-10-11 ENCOUNTER — Ambulatory Visit
Admission: RE | Admit: 2022-10-11 | Discharge: 2022-10-11 | Disposition: A | Discharge status: Home / Self Care | Payer: BC Managed Care – PPO | Source: Ambulatory Visit | Attending: Radiation Oncology | Admitting: Radiation Oncology

## 2022-10-11 DIAGNOSIS — C50412 Malignant neoplasm of upper-outer quadrant of left female breast: Secondary | ICD-10-CM | POA: Diagnosis not present

## 2022-10-11 LAB — RAD ONC ARIA SESSION SUMMARY
Course Elapsed Days: 1
Plan Fractions Treated to Date: 2
Plan Prescribed Dose Per Fraction: 2 Gy
Plan Total Fractions Prescribed: 25
Plan Total Prescribed Dose: 50 Gy
Reference Point Dosage Given to Date: 4 Gy
Reference Point Session Dosage Given: 2 Gy
Session Number: 2

## 2022-10-12 ENCOUNTER — Ambulatory Visit
Admission: RE | Admit: 2022-10-12 | Discharge: 2022-10-12 | Disposition: A | Discharge status: Home / Self Care | Payer: BC Managed Care – PPO | Source: Ambulatory Visit | Attending: Radiation Oncology | Admitting: Radiation Oncology

## 2022-10-12 ENCOUNTER — Other Ambulatory Visit: Payer: Self-pay

## 2022-10-12 DIAGNOSIS — C50412 Malignant neoplasm of upper-outer quadrant of left female breast: Secondary | ICD-10-CM

## 2022-10-12 LAB — RAD ONC ARIA SESSION SUMMARY
Course Elapsed Days: 2
Plan Fractions Treated to Date: 3
Plan Prescribed Dose Per Fraction: 2 Gy
Plan Total Fractions Prescribed: 25
Plan Total Prescribed Dose: 50 Gy
Reference Point Dosage Given to Date: 6 Gy
Reference Point Session Dosage Given: 2 Gy
Session Number: 3

## 2022-10-12 MED ORDER — RADIAPLEXRX EX GEL: Freq: Once | CUTANEOUS | Status: AC

## 2022-10-12 MED ORDER — ALRA NON-METALLIC DEODORANT (RAD-ONC)
1.0000 | Freq: Once | TOPICAL | Status: AC
  Administered 2022-10-12: 1 via TOPICAL

## 2022-10-15 ENCOUNTER — Other Ambulatory Visit: Payer: Self-pay

## 2022-10-15 ENCOUNTER — Ambulatory Visit
Admission: RE | Admit: 2022-10-15 | Discharge: 2022-10-15 | Disposition: A | Discharge status: Home / Self Care | Payer: BC Managed Care – PPO | Source: Ambulatory Visit | Attending: Radiation Oncology | Admitting: Radiation Oncology

## 2022-10-15 DIAGNOSIS — C50412 Malignant neoplasm of upper-outer quadrant of left female breast: Secondary | ICD-10-CM | POA: Diagnosis not present

## 2022-10-15 LAB — RAD ONC ARIA SESSION SUMMARY
Course Elapsed Days: 5
Plan Fractions Treated to Date: 4
Plan Prescribed Dose Per Fraction: 2 Gy
Plan Total Fractions Prescribed: 25
Plan Total Prescribed Dose: 50 Gy
Reference Point Dosage Given to Date: 8 Gy
Reference Point Session Dosage Given: 2 Gy
Session Number: 4

## 2022-10-16 ENCOUNTER — Ambulatory Visit
Admission: RE | Admit: 2022-10-16 | Discharge: 2022-10-16 | Disposition: A | Discharge status: Home / Self Care | Payer: BC Managed Care – PPO | Source: Ambulatory Visit | Attending: Radiation Oncology | Admitting: Radiation Oncology

## 2022-10-16 ENCOUNTER — Other Ambulatory Visit: Payer: Self-pay

## 2022-10-16 DIAGNOSIS — C50412 Malignant neoplasm of upper-outer quadrant of left female breast: Secondary | ICD-10-CM | POA: Diagnosis not present

## 2022-10-16 LAB — RAD ONC ARIA SESSION SUMMARY
Course Elapsed Days: 6
Plan Fractions Treated to Date: 5
Plan Prescribed Dose Per Fraction: 2 Gy
Plan Total Fractions Prescribed: 25
Plan Total Prescribed Dose: 50 Gy
Reference Point Dosage Given to Date: 10 Gy
Reference Point Session Dosage Given: 2 Gy
Session Number: 5

## 2022-10-17 ENCOUNTER — Other Ambulatory Visit: Payer: Self-pay

## 2022-10-17 ENCOUNTER — Ambulatory Visit
Admission: RE | Admit: 2022-10-17 | Discharge: 2022-10-17 | Disposition: A | Discharge status: Home / Self Care | Payer: BC Managed Care – PPO | Source: Ambulatory Visit | Attending: Radiation Oncology | Admitting: Radiation Oncology

## 2022-10-17 DIAGNOSIS — C50412 Malignant neoplasm of upper-outer quadrant of left female breast: Secondary | ICD-10-CM | POA: Diagnosis not present

## 2022-10-17 LAB — RAD ONC ARIA SESSION SUMMARY
Course Elapsed Days: 7
Plan Fractions Treated to Date: 6
Plan Prescribed Dose Per Fraction: 2 Gy
Plan Total Fractions Prescribed: 25
Plan Total Prescribed Dose: 50 Gy
Reference Point Dosage Given to Date: 12 Gy
Reference Point Session Dosage Given: 2 Gy
Session Number: 6

## 2022-10-18 ENCOUNTER — Other Ambulatory Visit: Payer: Self-pay

## 2022-10-18 ENCOUNTER — Ambulatory Visit
Admission: RE | Admit: 2022-10-18 | Discharge: 2022-10-18 | Disposition: A | Discharge status: Home / Self Care | Payer: BC Managed Care – PPO | Source: Ambulatory Visit | Attending: Radiation Oncology | Admitting: Radiation Oncology

## 2022-10-18 DIAGNOSIS — C50412 Malignant neoplasm of upper-outer quadrant of left female breast: Secondary | ICD-10-CM | POA: Diagnosis not present

## 2022-10-18 LAB — RAD ONC ARIA SESSION SUMMARY
Course Elapsed Days: 8
Plan Fractions Treated to Date: 7
Plan Prescribed Dose Per Fraction: 2 Gy
Plan Total Fractions Prescribed: 25
Plan Total Prescribed Dose: 50 Gy
Reference Point Dosage Given to Date: 14 Gy
Reference Point Session Dosage Given: 2 Gy
Session Number: 7

## 2022-10-19 ENCOUNTER — Ambulatory Visit
Admission: RE | Admit: 2022-10-19 | Discharge: 2022-10-19 | Disposition: A | Discharge status: Home / Self Care | Payer: BC Managed Care – PPO | Source: Ambulatory Visit | Attending: Radiation Oncology | Admitting: Radiation Oncology

## 2022-10-19 ENCOUNTER — Other Ambulatory Visit: Payer: Self-pay

## 2022-10-19 DIAGNOSIS — C50412 Malignant neoplasm of upper-outer quadrant of left female breast: Secondary | ICD-10-CM | POA: Diagnosis not present

## 2022-10-19 LAB — RAD ONC ARIA SESSION SUMMARY
Course Elapsed Days: 9
Plan Fractions Treated to Date: 8
Plan Prescribed Dose Per Fraction: 2 Gy
Plan Total Fractions Prescribed: 25
Plan Total Prescribed Dose: 50 Gy
Reference Point Dosage Given to Date: 16 Gy
Reference Point Session Dosage Given: 2 Gy
Session Number: 8

## 2022-10-22 ENCOUNTER — Other Ambulatory Visit: Payer: Self-pay

## 2022-10-22 ENCOUNTER — Ambulatory Visit
Admission: RE | Admit: 2022-10-22 | Discharge: 2022-10-22 | Disposition: A | Discharge status: Home / Self Care | Payer: BC Managed Care – PPO | Source: Ambulatory Visit | Attending: Radiation Oncology | Admitting: Radiation Oncology

## 2022-10-22 DIAGNOSIS — C50412 Malignant neoplasm of upper-outer quadrant of left female breast: Secondary | ICD-10-CM | POA: Diagnosis not present

## 2022-10-22 LAB — RAD ONC ARIA SESSION SUMMARY
Course Elapsed Days: 12
Course Elapsed Days: 12
Plan Fractions Treated to Date: 9
Plan Fractions Treated to Date: 10
Plan Prescribed Dose Per Fraction: 2 Gy
Plan Prescribed Dose Per Fraction: 2 Gy
Plan Total Fractions Prescribed: 25
Plan Total Fractions Prescribed: 25
Plan Total Prescribed Dose: 50 Gy
Plan Total Prescribed Dose: 50 Gy
Reference Point Dosage Given to Date: 18 Gy
Reference Point Dosage Given to Date: 20 Gy
Reference Point Session Dosage Given: 2 Gy
Reference Point Session Dosage Given: 2 Gy
Session Number: 9
Session Number: 10

## 2022-10-23 ENCOUNTER — Other Ambulatory Visit: Payer: Self-pay

## 2022-10-23 ENCOUNTER — Ambulatory Visit
Admission: RE | Admit: 2022-10-23 | Discharge: 2022-10-23 | Disposition: A | Discharge status: Home / Self Care | Payer: BC Managed Care – PPO | Source: Ambulatory Visit | Attending: Radiation Oncology | Admitting: Radiation Oncology

## 2022-10-23 DIAGNOSIS — C50412 Malignant neoplasm of upper-outer quadrant of left female breast: Secondary | ICD-10-CM | POA: Diagnosis not present

## 2022-10-23 LAB — RAD ONC ARIA SESSION SUMMARY
Course Elapsed Days: 13
Plan Fractions Treated to Date: 11
Plan Prescribed Dose Per Fraction: 2 Gy
Plan Total Fractions Prescribed: 25
Plan Total Prescribed Dose: 50 Gy
Reference Point Dosage Given to Date: 22 Gy
Reference Point Session Dosage Given: 2 Gy
Session Number: 11

## 2022-10-24 ENCOUNTER — Other Ambulatory Visit: Payer: Self-pay

## 2022-10-24 ENCOUNTER — Ambulatory Visit
Admission: RE | Admit: 2022-10-24 | Discharge: 2022-10-24 | Disposition: A | Discharge status: Home / Self Care | Payer: BC Managed Care – PPO | Source: Ambulatory Visit | Attending: Radiation Oncology | Admitting: Radiation Oncology

## 2022-10-24 DIAGNOSIS — C50412 Malignant neoplasm of upper-outer quadrant of left female breast: Secondary | ICD-10-CM | POA: Diagnosis present

## 2022-10-24 DIAGNOSIS — Z510 Encounter for antineoplastic radiation therapy: Secondary | ICD-10-CM | POA: Insufficient documentation

## 2022-10-24 DIAGNOSIS — Z170 Estrogen receptor positive status [ER+]: Secondary | ICD-10-CM | POA: Diagnosis not present

## 2022-10-24 LAB — RAD ONC ARIA SESSION SUMMARY
Course Elapsed Days: 14
Plan Fractions Treated to Date: 12
Plan Prescribed Dose Per Fraction: 2 Gy
Plan Total Fractions Prescribed: 25
Plan Total Prescribed Dose: 50 Gy
Reference Point Dosage Given to Date: 24 Gy
Reference Point Session Dosage Given: 2 Gy
Session Number: 12

## 2022-10-25 ENCOUNTER — Ambulatory Visit: Payer: BC Managed Care – PPO

## 2022-10-26 ENCOUNTER — Ambulatory Visit: Payer: BC Managed Care – PPO

## 2022-10-29 ENCOUNTER — Ambulatory Visit: Payer: BC Managed Care – PPO

## 2022-10-29 DIAGNOSIS — C50412 Malignant neoplasm of upper-outer quadrant of left female breast: Secondary | ICD-10-CM | POA: Diagnosis not present

## 2022-10-30 ENCOUNTER — Other Ambulatory Visit: Payer: Self-pay

## 2022-10-30 ENCOUNTER — Ambulatory Visit
Admission: RE | Admit: 2022-10-30 | Discharge: 2022-10-30 | Disposition: A | Discharge status: Home / Self Care | Payer: BC Managed Care – PPO | Source: Ambulatory Visit | Attending: Radiation Oncology | Admitting: Radiation Oncology

## 2022-10-30 DIAGNOSIS — C50412 Malignant neoplasm of upper-outer quadrant of left female breast: Secondary | ICD-10-CM | POA: Diagnosis not present

## 2022-10-30 LAB — RAD ONC ARIA SESSION SUMMARY
Course Elapsed Days: 20
Plan Fractions Treated to Date: 13
Plan Prescribed Dose Per Fraction: 2 Gy
Plan Total Fractions Prescribed: 25
Plan Total Prescribed Dose: 50 Gy
Reference Point Dosage Given to Date: 26 Gy
Reference Point Session Dosage Given: 2 Gy
Session Number: 13

## 2022-10-31 ENCOUNTER — Other Ambulatory Visit: Payer: Self-pay

## 2022-10-31 ENCOUNTER — Ambulatory Visit
Admission: RE | Admit: 2022-10-31 | Discharge: 2022-10-31 | Disposition: A | Discharge status: Home / Self Care | Payer: BC Managed Care – PPO | Source: Ambulatory Visit | Attending: Radiation Oncology | Admitting: Radiation Oncology

## 2022-10-31 DIAGNOSIS — C50412 Malignant neoplasm of upper-outer quadrant of left female breast: Secondary | ICD-10-CM | POA: Diagnosis not present

## 2022-10-31 LAB — RAD ONC ARIA SESSION SUMMARY
Course Elapsed Days: 21
Plan Fractions Treated to Date: 14
Plan Prescribed Dose Per Fraction: 2 Gy
Plan Total Fractions Prescribed: 25
Plan Total Prescribed Dose: 50 Gy
Reference Point Dosage Given to Date: 28 Gy
Reference Point Session Dosage Given: 2 Gy
Session Number: 14

## 2022-11-01 ENCOUNTER — Other Ambulatory Visit: Payer: Self-pay

## 2022-11-01 ENCOUNTER — Ambulatory Visit
Admission: RE | Admit: 2022-11-01 | Discharge: 2022-11-01 | Disposition: A | Discharge status: Home / Self Care | Payer: BC Managed Care – PPO | Source: Ambulatory Visit | Attending: Radiation Oncology | Admitting: Radiation Oncology

## 2022-11-01 DIAGNOSIS — C50412 Malignant neoplasm of upper-outer quadrant of left female breast: Secondary | ICD-10-CM | POA: Diagnosis not present

## 2022-11-01 LAB — RAD ONC ARIA SESSION SUMMARY
Course Elapsed Days: 22
Plan Fractions Treated to Date: 15
Plan Prescribed Dose Per Fraction: 2 Gy
Plan Total Fractions Prescribed: 25
Plan Total Prescribed Dose: 50 Gy
Reference Point Dosage Given to Date: 30 Gy
Reference Point Session Dosage Given: 2 Gy
Session Number: 15

## 2022-11-02 ENCOUNTER — Ambulatory Visit
Admission: RE | Admit: 2022-11-02 | Discharge: 2022-11-02 | Disposition: A | Discharge status: Home / Self Care | Payer: BC Managed Care – PPO | Source: Ambulatory Visit | Attending: Radiation Oncology | Admitting: Radiation Oncology

## 2022-11-02 ENCOUNTER — Other Ambulatory Visit: Payer: Self-pay

## 2022-11-02 ENCOUNTER — Ambulatory Visit: Payer: BC Managed Care – PPO | Admitting: Radiation Oncology

## 2022-11-02 DIAGNOSIS — C50412 Malignant neoplasm of upper-outer quadrant of left female breast: Secondary | ICD-10-CM | POA: Diagnosis not present

## 2022-11-02 LAB — RAD ONC ARIA SESSION SUMMARY
Course Elapsed Days: 23
Course Elapsed Days: 23
Plan Fractions Treated to Date: 16
Plan Fractions Treated to Date: 17
Plan Prescribed Dose Per Fraction: 2 Gy
Plan Prescribed Dose Per Fraction: 2 Gy
Plan Total Fractions Prescribed: 25
Plan Total Fractions Prescribed: 25
Plan Total Prescribed Dose: 50 Gy
Plan Total Prescribed Dose: 50 Gy
Reference Point Dosage Given to Date: 32 Gy
Reference Point Dosage Given to Date: 34 Gy
Reference Point Session Dosage Given: 1.2843 Gy
Reference Point Session Dosage Given: 2 Gy
Session Number: 16
Session Number: 17

## 2022-11-05 ENCOUNTER — Other Ambulatory Visit: Payer: Self-pay

## 2022-11-05 ENCOUNTER — Ambulatory Visit
Admission: RE | Admit: 2022-11-05 | Discharge: 2022-11-05 | Disposition: A | Discharge status: Home / Self Care | Payer: BC Managed Care – PPO | Source: Ambulatory Visit | Attending: Radiation Oncology | Admitting: Radiation Oncology

## 2022-11-05 DIAGNOSIS — C50412 Malignant neoplasm of upper-outer quadrant of left female breast: Secondary | ICD-10-CM | POA: Diagnosis not present

## 2022-11-05 LAB — RAD ONC ARIA SESSION SUMMARY
Course Elapsed Days: 26
Plan Fractions Treated to Date: 18
Plan Prescribed Dose Per Fraction: 2 Gy
Plan Total Fractions Prescribed: 25
Plan Total Prescribed Dose: 50 Gy
Reference Point Dosage Given to Date: 36 Gy
Reference Point Session Dosage Given: 2 Gy
Session Number: 18

## 2022-11-06 ENCOUNTER — Ambulatory Visit
Admission: RE | Admit: 2022-11-06 | Discharge: 2022-11-06 | Disposition: A | Discharge status: Home / Self Care | Payer: BC Managed Care – PPO | Source: Ambulatory Visit | Attending: Radiation Oncology | Admitting: Radiation Oncology

## 2022-11-06 ENCOUNTER — Other Ambulatory Visit: Payer: Self-pay

## 2022-11-06 DIAGNOSIS — C50412 Malignant neoplasm of upper-outer quadrant of left female breast: Secondary | ICD-10-CM | POA: Diagnosis not present

## 2022-11-06 LAB — RAD ONC ARIA SESSION SUMMARY
Course Elapsed Days: 27
Plan Fractions Treated to Date: 19
Plan Prescribed Dose Per Fraction: 2 Gy
Plan Total Fractions Prescribed: 25
Plan Total Prescribed Dose: 50 Gy
Reference Point Dosage Given to Date: 38 Gy
Reference Point Session Dosage Given: 2 Gy
Session Number: 19

## 2022-11-07 ENCOUNTER — Other Ambulatory Visit: Payer: Self-pay

## 2022-11-07 ENCOUNTER — Ambulatory Visit
Admission: RE | Admit: 2022-11-07 | Discharge: 2022-11-07 | Disposition: A | Discharge status: Home / Self Care | Payer: BC Managed Care – PPO | Source: Ambulatory Visit | Attending: Radiation Oncology | Admitting: Radiation Oncology

## 2022-11-07 DIAGNOSIS — C50412 Malignant neoplasm of upper-outer quadrant of left female breast: Secondary | ICD-10-CM | POA: Diagnosis not present

## 2022-11-07 LAB — RAD ONC ARIA SESSION SUMMARY
Course Elapsed Days: 28
Plan Fractions Treated to Date: 20
Plan Prescribed Dose Per Fraction: 2 Gy
Plan Total Fractions Prescribed: 25
Plan Total Prescribed Dose: 50 Gy
Reference Point Dosage Given to Date: 40 Gy
Reference Point Session Dosage Given: 2 Gy
Session Number: 20

## 2022-11-08 ENCOUNTER — Other Ambulatory Visit: Payer: Self-pay

## 2022-11-08 ENCOUNTER — Ambulatory Visit
Admission: RE | Admit: 2022-11-08 | Discharge: 2022-11-08 | Disposition: A | Discharge status: Home / Self Care | Payer: BC Managed Care – PPO | Source: Ambulatory Visit | Attending: Radiation Oncology | Admitting: Radiation Oncology

## 2022-11-08 DIAGNOSIS — C50412 Malignant neoplasm of upper-outer quadrant of left female breast: Secondary | ICD-10-CM | POA: Diagnosis not present

## 2022-11-08 LAB — RAD ONC ARIA SESSION SUMMARY
Course Elapsed Days: 29
Plan Fractions Treated to Date: 21
Plan Prescribed Dose Per Fraction: 2 Gy
Plan Total Fractions Prescribed: 25
Plan Total Prescribed Dose: 50 Gy
Reference Point Dosage Given to Date: 42 Gy
Reference Point Session Dosage Given: 2 Gy
Session Number: 21

## 2022-11-09 ENCOUNTER — Ambulatory Visit
Admission: RE | Admit: 2022-11-09 | Discharge: 2022-11-09 | Disposition: A | Discharge status: Home / Self Care | Payer: BC Managed Care – PPO | Source: Ambulatory Visit | Attending: Radiation Oncology | Admitting: Radiation Oncology

## 2022-11-09 ENCOUNTER — Other Ambulatory Visit: Payer: Self-pay

## 2022-11-09 ENCOUNTER — Ambulatory Visit: Payer: BC Managed Care – PPO | Admitting: Radiation Oncology

## 2022-11-09 DIAGNOSIS — C50412 Malignant neoplasm of upper-outer quadrant of left female breast: Secondary | ICD-10-CM | POA: Diagnosis not present

## 2022-11-09 LAB — RAD ONC ARIA SESSION SUMMARY
Course Elapsed Days: 30
Plan Fractions Treated to Date: 22
Plan Prescribed Dose Per Fraction: 2 Gy
Plan Total Fractions Prescribed: 25
Plan Total Prescribed Dose: 50 Gy
Reference Point Dosage Given to Date: 44 Gy
Reference Point Session Dosage Given: 2 Gy
Session Number: 22

## 2022-11-12 ENCOUNTER — Encounter: Payer: Self-pay | Admitting: *Deleted

## 2022-11-12 ENCOUNTER — Ambulatory Visit: Payer: BC Managed Care – PPO

## 2022-11-12 ENCOUNTER — Ambulatory Visit
Admission: RE | Admit: 2022-11-12 | Discharge: 2022-11-12 | Disposition: A | Discharge status: Home / Self Care | Payer: BC Managed Care – PPO | Source: Ambulatory Visit | Attending: Radiation Oncology | Admitting: Radiation Oncology

## 2022-11-12 ENCOUNTER — Other Ambulatory Visit: Payer: Self-pay

## 2022-11-12 DIAGNOSIS — C50412 Malignant neoplasm of upper-outer quadrant of left female breast: Secondary | ICD-10-CM

## 2022-11-12 LAB — RAD ONC ARIA SESSION SUMMARY
Course Elapsed Days: 33
Plan Fractions Treated to Date: 23
Plan Prescribed Dose Per Fraction: 2 Gy
Plan Total Fractions Prescribed: 25
Plan Total Prescribed Dose: 50 Gy
Reference Point Dosage Given to Date: 46 Gy
Reference Point Session Dosage Given: 2 Gy
Session Number: 23

## 2022-11-13 ENCOUNTER — Ambulatory Visit
Admission: RE | Admit: 2022-11-13 | Discharge: 2022-11-13 | Disposition: A | Discharge status: Home / Self Care | Payer: BC Managed Care – PPO | Source: Ambulatory Visit | Attending: Radiation Oncology | Admitting: Radiation Oncology

## 2022-11-13 ENCOUNTER — Other Ambulatory Visit: Payer: Self-pay

## 2022-11-13 DIAGNOSIS — C50412 Malignant neoplasm of upper-outer quadrant of left female breast: Secondary | ICD-10-CM | POA: Diagnosis not present

## 2022-11-13 LAB — RAD ONC ARIA SESSION SUMMARY
Course Elapsed Days: 34
Plan Fractions Treated to Date: 24
Plan Prescribed Dose Per Fraction: 2 Gy
Plan Total Fractions Prescribed: 25
Plan Total Prescribed Dose: 50 Gy
Reference Point Dosage Given to Date: 48 Gy
Reference Point Session Dosage Given: 2 Gy
Session Number: 24

## 2022-11-14 ENCOUNTER — Ambulatory Visit
Admission: RE | Admit: 2022-11-14 | Discharge: 2022-11-14 | Disposition: A | Discharge status: Home / Self Care | Payer: BC Managed Care – PPO | Source: Ambulatory Visit | Attending: Radiation Oncology | Admitting: Radiation Oncology

## 2022-11-14 ENCOUNTER — Other Ambulatory Visit: Payer: Self-pay

## 2022-11-14 DIAGNOSIS — C50412 Malignant neoplasm of upper-outer quadrant of left female breast: Secondary | ICD-10-CM | POA: Diagnosis not present

## 2022-11-14 LAB — RAD ONC ARIA SESSION SUMMARY
Course Elapsed Days: 35
Plan Fractions Treated to Date: 25
Plan Prescribed Dose Per Fraction: 2 Gy
Plan Total Fractions Prescribed: 25
Plan Total Prescribed Dose: 50 Gy
Reference Point Dosage Given to Date: 50 Gy
Reference Point Session Dosage Given: 2 Gy
Session Number: 25

## 2022-11-15 ENCOUNTER — Ambulatory Visit
Admission: RE | Admit: 2022-11-15 | Discharge: 2022-11-15 | Disposition: A | Discharge status: Home / Self Care | Payer: BC Managed Care – PPO | Source: Ambulatory Visit | Attending: Radiation Oncology | Admitting: Radiation Oncology

## 2022-11-15 ENCOUNTER — Other Ambulatory Visit: Payer: Self-pay

## 2022-11-15 ENCOUNTER — Ambulatory Visit: Payer: BC Managed Care – PPO

## 2022-11-15 DIAGNOSIS — C50412 Malignant neoplasm of upper-outer quadrant of left female breast: Secondary | ICD-10-CM | POA: Diagnosis not present

## 2022-11-15 LAB — RAD ONC ARIA SESSION SUMMARY
Course Elapsed Days: 36
Plan Fractions Treated to Date: 1
Plan Prescribed Dose Per Fraction: 1.8 Gy
Plan Total Fractions Prescribed: 8
Plan Total Prescribed Dose: 14.4 Gy
Reference Point Dosage Given to Date: 1.8 Gy
Reference Point Session Dosage Given: 1.8 Gy
Session Number: 26

## 2022-11-16 ENCOUNTER — Ambulatory Visit: Payer: BC Managed Care – PPO

## 2022-11-16 ENCOUNTER — Ambulatory Visit
Admission: RE | Admit: 2022-11-16 | Discharge: 2022-11-16 | Disposition: A | Discharge status: Home / Self Care | Payer: BC Managed Care – PPO | Source: Ambulatory Visit | Attending: Radiation Oncology | Admitting: Radiation Oncology

## 2022-11-16 ENCOUNTER — Other Ambulatory Visit: Payer: Self-pay

## 2022-11-16 DIAGNOSIS — C50412 Malignant neoplasm of upper-outer quadrant of left female breast: Secondary | ICD-10-CM | POA: Diagnosis not present

## 2022-11-16 LAB — RAD ONC ARIA SESSION SUMMARY
Course Elapsed Days: 37
Plan Fractions Treated to Date: 2
Plan Prescribed Dose Per Fraction: 1.8 Gy
Plan Total Fractions Prescribed: 8
Plan Total Prescribed Dose: 14.4 Gy
Reference Point Dosage Given to Date: 3.6 Gy
Reference Point Session Dosage Given: 1.8 Gy
Session Number: 27

## 2022-11-20 ENCOUNTER — Ambulatory Visit
Admission: RE | Admit: 2022-11-20 | Discharge: 2022-11-20 | Disposition: A | Discharge status: Home / Self Care | Payer: BC Managed Care – PPO | Source: Ambulatory Visit | Attending: Radiation Oncology | Admitting: Radiation Oncology

## 2022-11-20 ENCOUNTER — Ambulatory Visit: Payer: BC Managed Care – PPO

## 2022-11-20 ENCOUNTER — Other Ambulatory Visit: Payer: Self-pay

## 2022-11-20 DIAGNOSIS — C50412 Malignant neoplasm of upper-outer quadrant of left female breast: Secondary | ICD-10-CM | POA: Diagnosis not present

## 2022-11-20 LAB — RAD ONC ARIA SESSION SUMMARY
Course Elapsed Days: 41
Plan Fractions Treated to Date: 3
Plan Prescribed Dose Per Fraction: 1.8 Gy
Plan Total Fractions Prescribed: 8
Plan Total Prescribed Dose: 14.4 Gy
Reference Point Dosage Given to Date: 5.4 Gy
Reference Point Session Dosage Given: 1.8 Gy
Session Number: 28

## 2022-11-21 ENCOUNTER — Ambulatory Visit
Admission: RE | Admit: 2022-11-21 | Discharge: 2022-11-21 | Disposition: A | Discharge status: Home / Self Care | Payer: BC Managed Care – PPO | Source: Ambulatory Visit | Attending: Radiation Oncology | Admitting: Radiation Oncology

## 2022-11-21 ENCOUNTER — Other Ambulatory Visit: Payer: Self-pay

## 2022-11-21 ENCOUNTER — Ambulatory Visit: Payer: BC Managed Care – PPO

## 2022-11-21 DIAGNOSIS — C50412 Malignant neoplasm of upper-outer quadrant of left female breast: Secondary | ICD-10-CM | POA: Diagnosis not present

## 2022-11-21 LAB — RAD ONC ARIA SESSION SUMMARY
Course Elapsed Days: 42
Plan Fractions Treated to Date: 4
Plan Prescribed Dose Per Fraction: 1.8 Gy
Plan Total Fractions Prescribed: 8
Plan Total Prescribed Dose: 14.4 Gy
Reference Point Dosage Given to Date: 7.2 Gy
Reference Point Session Dosage Given: 1.8 Gy
Session Number: 29

## 2022-11-22 ENCOUNTER — Ambulatory Visit
Admission: RE | Admit: 2022-11-22 | Discharge: 2022-11-22 | Disposition: A | Discharge status: Home / Self Care | Payer: BC Managed Care – PPO | Source: Ambulatory Visit | Attending: Radiation Oncology | Admitting: Radiation Oncology

## 2022-11-22 ENCOUNTER — Ambulatory Visit: Payer: BC Managed Care – PPO

## 2022-11-22 ENCOUNTER — Other Ambulatory Visit: Payer: Self-pay

## 2022-11-22 DIAGNOSIS — C50412 Malignant neoplasm of upper-outer quadrant of left female breast: Secondary | ICD-10-CM | POA: Diagnosis not present

## 2022-11-22 LAB — RAD ONC ARIA SESSION SUMMARY
Course Elapsed Days: 43
Plan Fractions Treated to Date: 5
Plan Prescribed Dose Per Fraction: 1.8 Gy
Plan Total Fractions Prescribed: 8
Plan Total Prescribed Dose: 14.4 Gy
Reference Point Dosage Given to Date: 9 Gy
Reference Point Session Dosage Given: 1.8 Gy
Session Number: 30

## 2022-11-23 ENCOUNTER — Ambulatory Visit: Payer: BC Managed Care – PPO

## 2022-11-23 ENCOUNTER — Other Ambulatory Visit: Payer: Self-pay

## 2022-11-23 ENCOUNTER — Ambulatory Visit
Admission: RE | Admit: 2022-11-23 | Discharge: 2022-11-23 | Disposition: A | Discharge status: Home / Self Care | Payer: BC Managed Care – PPO | Source: Ambulatory Visit | Attending: Radiation Oncology | Admitting: Radiation Oncology

## 2022-11-23 DIAGNOSIS — C50412 Malignant neoplasm of upper-outer quadrant of left female breast: Secondary | ICD-10-CM | POA: Diagnosis not present

## 2022-11-23 LAB — RAD ONC ARIA SESSION SUMMARY
Course Elapsed Days: 44
Plan Fractions Treated to Date: 6
Plan Prescribed Dose Per Fraction: 1.8 Gy
Plan Total Fractions Prescribed: 8
Plan Total Prescribed Dose: 14.4 Gy
Reference Point Dosage Given to Date: 10.8 Gy
Reference Point Session Dosage Given: 1.8 Gy
Session Number: 31

## 2022-11-25 NOTE — Assessment & Plan Note (Signed)
Invasive ductal carcinoma, pT1cN0Mo, stage IA, G3, with DCIS, ER positive, PR positive, HER2 negative, Ki-67 30% -She presented with a palpable left breast mass. -I discussed her surgical pathology findings, which showed a 1.8 cm invasive ductal carcinoma, grade 3, no lymphovascular invasion, 4 lymph nodes were negative, anterior margin was not positive. -I discussed her Oncotype results, recurrence score was 14, which predated 4% risk of distant recurrence in the next 9 years if she takes antiestrogen therapy.  There is no benefit for chemotherapy. -She is tolerating adjuvant radiation well, and will complete tomorrow. ---Giving her strongly ER and PR positivity of the tumor cells, and her pre-menopause status, I recommend adjuvant endocrine therapy with tamoxifen. The potential side effects, which includes but not limited to, hot flash, skin and vaginal dryness, slightly increased risk of cardiovascular disease and cataract, small risk of thrombosis and endometrial cancer, were discussed with her in great details. Preventive strategies for thrombosis, such as being physically active, using compression stocks, avoid cigarette smoking, etc., were reviewed with her. I also recommend her to follow-up with her gynecologist once a year, and watch for vaginal spotting or bleeding, as a clinically sign of endometrial cancer, etc. She voiced good understanding, and agrees to proceed. Will start in 2-3 weeks -Alternative adjuvant endocrine therapy, such as aromatase inhibitor and overexpression were discussed with her also, given her young age. Due to her very early stage breast cancer and low risk of recurrence, I do not strongly feel this is necessary. -We also reviewed the breast cancer surveillance, including annual mammogram, and physical exam.

## 2022-11-26 ENCOUNTER — Inpatient Hospital Stay: Payer: BC Managed Care – PPO | Attending: Hematology | Admitting: Hematology

## 2022-11-26 ENCOUNTER — Other Ambulatory Visit: Payer: Self-pay

## 2022-11-26 ENCOUNTER — Ambulatory Visit: Payer: BC Managed Care – PPO

## 2022-11-26 ENCOUNTER — Encounter: Payer: Self-pay | Admitting: Hematology

## 2022-11-26 ENCOUNTER — Ambulatory Visit
Admission: RE | Admit: 2022-11-26 | Discharge: 2022-11-26 | Disposition: A | Discharge status: Home / Self Care | Payer: BC Managed Care – PPO | Source: Ambulatory Visit | Attending: Radiation Oncology | Admitting: Radiation Oncology

## 2022-11-26 VITALS — BP 105/67 | HR 67 | Temp 98.4°F | Resp 18 | Ht 65.0 in | Wt 151.6 lb

## 2022-11-26 DIAGNOSIS — Z510 Encounter for antineoplastic radiation therapy: Secondary | ICD-10-CM | POA: Insufficient documentation

## 2022-11-26 DIAGNOSIS — Z170 Estrogen receptor positive status [ER+]: Secondary | ICD-10-CM | POA: Diagnosis not present

## 2022-11-26 DIAGNOSIS — Z79899 Other long term (current) drug therapy: Secondary | ICD-10-CM | POA: Insufficient documentation

## 2022-11-26 DIAGNOSIS — Z79810 Long term (current) use of selective estrogen receptor modulators (SERMs): Secondary | ICD-10-CM | POA: Insufficient documentation

## 2022-11-26 DIAGNOSIS — K5900 Constipation, unspecified: Secondary | ICD-10-CM | POA: Diagnosis not present

## 2022-11-26 DIAGNOSIS — Z7982 Long term (current) use of aspirin: Secondary | ICD-10-CM | POA: Insufficient documentation

## 2022-11-26 DIAGNOSIS — D500 Iron deficiency anemia secondary to blood loss (chronic): Secondary | ICD-10-CM | POA: Diagnosis not present

## 2022-11-26 DIAGNOSIS — N920 Excessive and frequent menstruation with regular cycle: Secondary | ICD-10-CM | POA: Insufficient documentation

## 2022-11-26 DIAGNOSIS — C50412 Malignant neoplasm of upper-outer quadrant of left female breast: Secondary | ICD-10-CM | POA: Diagnosis present

## 2022-11-26 DIAGNOSIS — Z923 Personal history of irradiation: Secondary | ICD-10-CM | POA: Insufficient documentation

## 2022-11-26 DIAGNOSIS — Z86711 Personal history of pulmonary embolism: Secondary | ICD-10-CM | POA: Insufficient documentation

## 2022-11-26 LAB — RAD ONC ARIA SESSION SUMMARY
Course Elapsed Days: 47
Plan Fractions Treated to Date: 7
Plan Prescribed Dose Per Fraction: 1.8 Gy
Plan Total Fractions Prescribed: 8
Plan Total Prescribed Dose: 14.4 Gy
Reference Point Dosage Given to Date: 12.6 Gy
Reference Point Session Dosage Given: 1.8 Gy
Session Number: 32

## 2022-11-26 MED ORDER — TAMOXIFEN CITRATE 20 MG PO TABS: 20.0000 mg | ORAL_TABLET | Freq: Every day | ORAL | 3 refills | Status: DC

## 2022-11-26 NOTE — Progress Notes (Signed)
[facility]   Telephone:(336) [phone] Fax:(336) [phone]   Clinic Follow up Note   Patient Care Team: Maurice Small, MD (Inactive) as PCP - General (Family Medicine) Pollyann Samples, NP as Nurse Practitioner (Hematology and Oncology) Pershing Proud, RN as Oncology Nurse Navigator Donnelly Angelica, RN as Oncology Nurse Navigator Malachy Mood, MD as Consulting Physician (Hematology)  Date of Service:  11/26/2022  CHIEF COMPLAINT: f/u of New breast cancer diagnosis, history of IDA and unprovoked PE   CURRENT THERAPY:  Adjuvant radiation, to be completed tomorrow Pending adjuvant tamoxifen  ASSESSMENT:  Tanya Rangel is a 42 y.o. female with  Primary malignant neoplasm of upper outer quadrant of left breast (HCC) Invasive ductal carcinoma, pT1cN0Mo, stage IA, G3, with DCIS, ER positive, PR positive, HER2 negative, Ki-67 30% -She presented with a palpable left breast mass. -I discussed her surgical pathology findings, which showed a 1.8 cm invasive ductal carcinoma, grade 3, no lymphovascular invasion, 4 lymph nodes were negative, anterior margin was not positive. -I discussed her Oncotype results, recurrence score was 14, which predated 4% risk of distant recurrence in the next 9 years if she takes antiestrogen therapy.  There is no benefit for chemotherapy. -She is tolerating adjuvant radiation well, and will complete tomorrow. ---Giving her strongly ER and PR positivity of the tumor cells, and her pre-menopause status, I recommend adjuvant endocrine therapy with tamoxifen. The potential side effects, which includes but not limited to, hot flash, skin and vaginal dryness, slightly increased risk of cardiovascular disease and cataract, small risk of thrombosis and endometrial cancer, were discussed with her in great details. Preventive strategies for thrombosis, such as being physically active, using compression stocks, avoid cigarette smoking, etc., were reviewed with her. I  also recommend her to follow-up with her gynecologist once a year, and watch for vaginal spotting or bleeding, as a clinically sign of endometrial cancer, etc. She voiced good understanding, and agrees to proceed. Will start in 2-3 weeks -She did have a history of small segmental PE in April 2020, unprovoked, she received Xarelto for 6 months.  Her hypercoagulopathy workup was negative. -I recommend her to take a baby aspirin when she is on tamoxifen. -Alternative adjuvant endocrine therapy, such as aromatase inhibitor and overexpression were discussed with her also, given her young age. Due to her very early stage breast cancer and low risk of recurrence, I do not strongly feel this is necessary.  However if she does have BSO, I would recommend switching tamoxifen to AI, due to her previous risk of PE. -We also reviewed the breast cancer surveillance, including annual mammogram, and physical exam.    3. IDA secondary to menorrhagia  -Ferritin 5 on 11/07/2018, she has taken oral iron intermittently in the past. Tolerates moderately well with constipation which is improved with magnesium -Ferritin improved to 37 in 03/2019 -Recent pelvic US showed fibroids measuring up to 6 cm, normal endometrial thickness. She will see ob/gyn soon. Given her ER+ breast cancer we would not advise estrogen containing products   -She is scheduled for endometrial ablation in August.       PLAN: - follow up with GYN - start taking Tamoxifen 20mg  in 2-4 weeks, I called in today  - start taking a baby aspirin daily along with Tamoxifen  - encourage patient to stay active - see Santiago Glad NP for survivorship in 3 months - f/u in 6 months with me      SUMMARY OF ONCOLOGIC HISTORY: Oncology  History  Primary malignant neoplasm of upper outer quadrant of left breast (HCC)  07/25/2022 Imaging   Mammo/US IMPRESSION: 1. Irregular 1.2 cm mass in left breast at 2 o'clock, 5 cm the nipple, corresponding to the palpable  lump, highly suspicious breast malignancy. 2. Small group calcifications in the posterior, lateral left breast, without change compared to prior exams dating back to 0/24/2022. Given the stability, these are likely to be benign calcifications, but have some increased suspicion due to adjacent palpable.   08/02/2022 Initial Biopsy   1. Breast, left, needle core biopsy, 2 o'clock, 5cmfn (ribbon) INVASIVE MODERATELY DIFFERENTIATED DUCTAL ADENOCARCINOMA, GRADE 2 (3+2+1) FOCAL DUCTAL CARCINOMA IN SITU, INTERMEDIATE NUCLEAR GRADE, SOLID TYPE WITHOUT NECROSIS TUBULE FORMATION: SCORE 3 NUCLEAR PLEOMORPHISM: SCORE 2 MITOTIC COUNT: SCORE 1 TOTAL SCORE: 6 OF 9 OVERALL GRADE: GRADE 2 NEGATIVE FOR LYMPHOVASCULAR INVASION NEGATIVE FOR MICROCALCIFICATIONS INVASIVE TUMOR MEASURES 6 MM IN GREATEST LINEAR EXTENT  1. Breast, left, needle core biopsy, 2 o'clock, 5 cmfn (ribbon) PROGNOSTIC INDICATORS  The tumor cells are equivocal for Her2 (2+). Her2 by FISH will be performed and the results reported separately. Estrogen Receptor: 95%, POSITIVE, MODERATE-STRONG STAINING INTENSITY Progesterone Receptor: 50%, POSITIVE, MODERATE-STRONG STAINING INTENSITY Proliferation Marker Ki67: 30%  1. Breast,left, needle core biopsy,2.o'clock,5 cmfn (ribbon) FLUORESCENCE IN-SITU HYBRIDIZATION Results: GROUP 5: HER2 **NEGATIVE**  2. Breast, left, needle core biopsy, UOQ (coil) INVASIVE MODERATELY DIFFERENTIATED DUCTAL ADENOCARCINOMA, GRADE 2 (3+2+1) FOCAL DUCTAL CARCINOMA IN SITU, INTERMEDIATE NUCLEAR GRADE NECROSIS TUBULE FORMATION: SCORE 3 NUCLEAR PLEOMORPHISM: SCORE 2 MITOTIC COUNT: SCORE 1 TOTAL SCORE: 6 OF 9 OVERALL GRADE: GRADE 2 MICROCALCIFICATIONS PRESENT WITHIN PERITUMORAL STROMA, DCIS AND COLUMNAR CELL CHANGE NEGATIVE FOR ANGIOLYMPHATIC INVASION BACKGROUND FIBROCYSTIC CHANGES INCLUDING STROMAL FIBROSIS, CYSTIC DILATATION OF DUCTS, APOCRINE METAPLASIA AND COLUMNAR CELL CHANGE TUMOR MEASURES 1.25 MM IN  GREATEST LINEAR EXTENT  2. Breast, left, needle core biopsy, UOQ (coil) PROGNOSTIC INDICATORS  The tumor cells are equivocal for Her2 (2+). Her2 by FISH will be performed and the results reported separately. Estrogen Receptor: 99%, POSITIVE, STRONG STAINING INTENSITY Progesterone Receptor: 30%, POSITIVE, WEAK-MODERATE STAINING INTENSITY Proliferation Marker Ki67: 10%  2. Breast, left, needle core biopsy, UOQ(coil) FLUORESCENCE IN-SITU HYBRIDIZATION Results: GROUP 5: HER2 **NEGATIVE**     08/15/2022 Initial Diagnosis   Primary malignant neoplasm of upper outer quadrant of left breast (HCC)   08/15/2022 Cancer Staging   Staging form: Breast, AJCC 8th Edition - Clinical stage from 08/15/2022: Stage IA (cT1c, cN0, cM0, G2, ER+, PR+, HER2-) - Signed by Ronny Bacon, PA-C on 08/15/2022 Stage prefix: Initial diagnosis Method of lymph node assessment: Clinical Histologic grading system: 3 grade system    Genetic Testing   [facility] Custom Panel+RNA was Negative. Report date is 08/24/2022.  The Custom Hereditary Cancers Panel offered by [employer] includes sequencing and/or deletion duplication testing of the following 43 genes: APC, ATM, AXIN2, BAP1, BARD1, BMPR1A, BRCA1, BRCA2, BRIP1, CDH1, CDK4, CDKN2A (p14ARF and p16INK4a only), CHEK2, CTNNA1, EPCAM (Deletion/duplication testing only), FH, GREM1 (promoter region duplication testing only), HOXB13, KIT, MBD4, MEN1, MLH1, MSH2, MSH3, MSH6, MUTYH, NF1, NHTL1, PALB2, PDGFRA, PMS2, POLD1, POLE, PTEN, RAD51C, RAD51D, SMAD4, SMARCA4. STK11, TP53, TSC1, TSC2, and VHL.    08/30/2022 Cancer Staging   Staging form: Breast, AJCC 8th Edition - Pathologic stage from 08/30/2022: Stage IA (pT1c, pN0, cM0, G3, ER+, PR+, HER2-, Oncotype DX score: 13) - Signed by Malachy Mood, MD on 11/25/2022 Stage prefix: Initial diagnosis Nuclear grade: G3 Multigene prognostic tests performed: Oncotype DX Recurrence score range: Greater than or  equal to 11 Histologic grading  system: 3 grade system Residual tumor (R): R0 - None      INTERVAL HISTORY:  Tanya Rangel is here for a follow up of New breast cancer diagnosis, history of IDA and unprovoked PE. She was last seen by Santiago Glad NP on 08/16/2022. She presents to clinic today alone. Patient states she feels well today. No issues since her surgery. Some peeling with radiation. Radiation went well.    All other systems were reviewed with the patient and are negative.  MEDICAL HISTORY:  Past Medical History:  Diagnosis Date   Anemia    Breast cancer (HCC) 08/02/2022   Hx of abnormal cervical Pap smear    PE (pulmonary thromboembolism) (HCC)    April 2020- was on Xarelto for 6 months    SURGICAL HISTORY: Past Surgical History:  Procedure Laterality Date   BREAST BIOPSY Left 08/02/2022   [place] LT BREAST BX W LOC DEV 1ST LESION IMG BX SPEC US GUIDE 08/02/2022 [facility]   BREAST BIOPSY Left 08/02/2022   MM LT BREAST BX W LOC DEV 1ST LESION IMAGE BX SPEC STEREO GUIDE 08/02/2022 [facility]   BREAST BIOPSY  08/29/2022   MM LT RADIOACTIVE SEED LOC MAMMO GUIDE 08/29/2022 [facility]   BREAST BIOPSY  08/29/2022   MM LT RADIOACTIVE SEED EA ADD LESION LOC MAMMO GUIDE 08/29/2022 [facility]   BREAST LUMPECTOMY WITH RADIOACTIVE SEED AND SENTINEL LYMPH NODE BIOPSY Left 08/30/2022   Procedure: LEFT BREAST LUMPECTOMY WITH RADIOACTIVE SEED X2 AND SENTINEL LYMPH NODE BIOPSY;  Surgeon: Abigail Miyamoto, MD;  Location: [facility] OR;  Service: General;  Laterality: Left;   CESAREAN SECTION  11/13/2007   COLPOSCOPY      I have reviewed the social history and family history with the patient and they are unchanged from previous note.  ALLERGIES:  has No Known Allergies.  MEDICATIONS:  Current Outpatient Medications  Medication Sig Dispense Refill   tamoxifen (NOLVADEX) 20 MG tablet Take 1 tablet (20 mg total) by mouth daily. 30 tablet 3   calcium carbonate (SUPER CALCIUM) 1500 (600 Ca) MG TABS tablet  Take 600 mg of elemental calcium by mouth daily.     cholecalciferol (VITAMIN D3) 25 MCG (1000 UNIT) tablet Take 1,000 Units by mouth daily.     ferrous sulfate 325 (65 FE) MG tablet TAKE 1 TABLET BY MOUTH EVERY DAY WITH BREAKFAST 30 tablet 3   Magnesium 400 MG CAPS Take 400 mg by mouth daily.     traMADol (ULTRAM) 50 MG tablet Take 1 tablet (50 mg total) by mouth every 6 (six) hours as needed for moderate pain or severe pain. 25 tablet 0   No current facility-administered medications for this visit.    PHYSICAL EXAMINATION: ECOG PERFORMANCE STATUS: 0 - Asymptomatic  Vitals:   11/26/22 0838  BP: 105/67  Pulse: 67  Resp: 18  Temp: 98.4 F (36.9 C)  SpO2: 100%   Wt Readings from Last 3 Encounters:  11/26/22 151 lb 9.6 oz (68.8 kg)  08/30/22 142 lb (64.4 kg)  08/27/22 143 lb (64.9 kg)     GENERAL:alert, no distress and comfortable SKIN: skin color, texture, turgor are normal, no rashes or significant lesions EYES: normal, Conjunctiva are pink and non-injected, sclera clear Musculoskeletal:no cyanosis of digits and no clubbing  NEURO: alert & oriented x 3 with fluent speech, no focal motor/sensory deficits  LABORATORY DATA:  I have reviewed the data as listed    Latest Ref Rng &  Units 08/16/2022   12:10 PM 04/09/2019   11:57 AM 11/07/2018   11:04 AM  CBC  WBC 4.0 - 10.5 K/uL 3.6  3.6  3.2   Hemoglobin 12.0 - 15.0 g/dL 16.1  09.6  9.8   Hematocrit 36.0 - 46.0 % 36.0  35.9  30.9   Platelets 150 - 400 K/uL 352  280  282         Latest Ref Rng & Units 08/16/2022   12:10 PM 04/09/2019   11:57 AM 11/07/2018   11:04 AM  CMP  Glucose 70 - 99 mg/dL 74  90  92   BUN 6 - 20 mg/dL 5  5  7    Creatinine 0.44 - 1.00 mg/dL 0.45  4.09  8.11   Sodium 135 - 145 mmol/L 139  140  138   Potassium 3.5 - 5.1 mmol/L 4.3  4.1  4.3   Chloride 98 - 111 mmol/L 103  105  107   CO2 22 - 32 mmol/L 27  27  26    Calcium 8.9 - 10.3 mg/dL 9.5  9.0  8.8   Total Protein 6.5 - 8.1 g/dL 7.3  7.0  7.1    Total Bilirubin 0.3 - 1.2 mg/dL 0.4  0.4  0.4   Alkaline Phos 38 - 126 U/L 54  59  64   AST 15 - 41 U/L 25  21  21    ALT 0 - 44 U/L 20  23  29        RADIOGRAPHIC STUDIES: I have personally reviewed the radiological images as listed and agreed with the findings in the report. No results found.    No orders of the defined types were placed in this encounter.  All questions were answered. The patient knows to call the clinic with any problems, questions or concerns. No barriers to learning was detected. The total time spent in the appointment was 30 minutes.     Malachy Mood, MD 11/26/2022   I, Sharlette Dense, CMA, am acting as scribe for Malachy Mood, MD.   I have reviewed the above documentation for accuracy and completeness, and I agree with the above.

## 2022-11-27 ENCOUNTER — Other Ambulatory Visit: Payer: Self-pay

## 2022-11-27 ENCOUNTER — Ambulatory Visit
Admission: RE | Admit: 2022-11-27 | Discharge: 2022-11-27 | Disposition: A | Discharge status: Home / Self Care | Payer: BC Managed Care – PPO | Source: Ambulatory Visit | Attending: Radiation Oncology | Admitting: Radiation Oncology

## 2022-11-27 ENCOUNTER — Ambulatory Visit: Payer: BC Managed Care – PPO

## 2022-11-27 DIAGNOSIS — Z170 Estrogen receptor positive status [ER+]: Secondary | ICD-10-CM | POA: Diagnosis not present

## 2022-11-27 DIAGNOSIS — Z510 Encounter for antineoplastic radiation therapy: Secondary | ICD-10-CM | POA: Diagnosis present

## 2022-11-27 DIAGNOSIS — C50412 Malignant neoplasm of upper-outer quadrant of left female breast: Secondary | ICD-10-CM | POA: Diagnosis not present

## 2022-11-27 LAB — RAD ONC ARIA SESSION SUMMARY
Course Elapsed Days: 48
Plan Fractions Treated to Date: 8
Plan Prescribed Dose Per Fraction: 1.8 Gy
Plan Total Fractions Prescribed: 8
Plan Total Prescribed Dose: 14.4 Gy
Reference Point Dosage Given to Date: 14.4 Gy
Reference Point Session Dosage Given: 1.8 Gy
Session Number: 33

## 2022-11-28 ENCOUNTER — Ambulatory Visit: Payer: BC Managed Care – PPO

## 2022-11-29 ENCOUNTER — Ambulatory Visit: Payer: BC Managed Care – PPO

## 2022-11-30 ENCOUNTER — Ambulatory Visit: Payer: BC Managed Care – PPO

## 2022-11-30 NOTE — Radiation Completion Notes (Addendum)
  Radiation Oncology         [phone] ________________________________  Name: Tanya Rangel MRN: [id number]  Date of Service: 11/27/2022  DOB: [birth year]  End of Treatment Note   Diagnosis:  Stage IA, pT1cN0M0, grade 3, ER/PR positive invasive ductal carcinoma of the left breast   Intent: Curative     ==========DELIVERED PLANS==========  First Treatment Date: 2022-10-10 - Last Treatment Date: 2022-11-27   Plan Name: Breast_L_BH Site: Breast, Left Technique: 3D Mode: Photon Dose Per Fraction: 2 Gy Prescribed Dose (Delivered / Prescribed): 50 Gy / 50 Gy Prescribed Fxs (Delivered / Prescribed): 25 / 25   Plan Name: Brst_L_Bst_BH Site: Breast, Left Technique: 3D Mode: Photon Dose Per Fraction: 1.8 Gy Prescribed Dose (Delivered / Prescribed): 14.4 Gy / 14.4 Gy Prescribed Fxs (Delivered / Prescribed): 8 / 8     ==========ON TREATMENT VISIT DATES========== 2022-10-12, 2022-10-19, 2022-11-02, 2022-11-09, 2022-11-16, 2022-11-23   See weekly On Treatment Notes in Epic for details. The patient tolerated radiation. She developed fatigue and anticipated skin changes in the treatment field.   The patient will receive a call in about one month from the radiation oncology department. She will continue follow up with Dr. Mosetta Putt as well.      Osker Mason, PAC

## 2022-12-10 ENCOUNTER — Ambulatory Visit: Payer: BC Managed Care – PPO | Attending: Surgery

## 2022-12-10 VITALS — Wt 150.0 lb

## 2022-12-10 DIAGNOSIS — Z483 Aftercare following surgery for neoplasm: Secondary | ICD-10-CM | POA: Insufficient documentation

## 2022-12-10 NOTE — Therapy (Signed)
OUTPATIENT PHYSICAL THERAPY SOZO SCREENING NOTE   Patient Name: Tanya Rangel MRN: [id number] DOB:January 12, 1981, 42 y.o., female Today's Date: 12/10/2022  PCP: Maurice Small, MD (Inactive) REFERRING PROVIDER: Abigail Miyamoto, MD   PT End of Session - 12/10/22 0804     Visit Number 2   # unchanged due to screen only   PT Start Time 0802    PT Stop Time 0805    PT Time Calculation (min) 3 min    Activity Tolerance Patient tolerated treatment well    Behavior During Therapy [facility] for tasks assessed/performed             Past Medical History:  Diagnosis Date   Anemia    Breast cancer (HCC) 08/02/2022   Hx of abnormal cervical Pap smear    PE (pulmonary thromboembolism) (HCC)    April 2020- was on Xarelto for 6 months   Past Surgical History:  Procedure Laterality Date   BREAST BIOPSY Left 08/02/2022   [place] LT BREAST BX W LOC DEV 1ST LESION IMG BX SPEC US GUIDE 08/02/2022 [facility]   BREAST BIOPSY Left 08/02/2022   MM LT BREAST BX W LOC DEV 1ST LESION IMAGE BX SPEC STEREO GUIDE 08/02/2022 [facility]   BREAST BIOPSY  08/29/2022   MM LT RADIOACTIVE SEED LOC MAMMO GUIDE 08/29/2022 [facility]   BREAST BIOPSY  08/29/2022   MM LT RADIOACTIVE SEED EA ADD LESION LOC MAMMO GUIDE 08/29/2022 [facility]   BREAST LUMPECTOMY WITH RADIOACTIVE SEED AND SENTINEL LYMPH NODE BIOPSY Left 08/30/2022   Procedure: LEFT BREAST LUMPECTOMY WITH RADIOACTIVE SEED X2 AND SENTINEL LYMPH NODE BIOPSY;  Surgeon: Abigail Miyamoto, MD;  Location: [facility] OR;  Service: General;  Laterality: Left;   CESAREAN SECTION  11/13/2007   COLPOSCOPY     Patient Active Problem List   Diagnosis Date Noted   Genetic testing 08/27/2022   Iron deficiency anemia due to chronic blood loss 08/16/2022   Primary malignant neoplasm of upper outer quadrant of left breast (HCC) 08/15/2022    REFERRING DIAG: left breast cancer at risk for lymphedema  THERAPY DIAG: Aftercare following surgery for  neoplasm  PERTINENT HISTORY: Patient was diagnosed on 08/02/22 with left grade 2. She underwent mammograms and ultrasound showing a 1.2 cm mass at the 2 o'clock position of the left breast and deeper in the breast there were calcifications measuring 7 mm. Both areas were biopsied and both showed both invasive ductal carcinoma and ductal carcinoma in situ. The larger area showed 95% ER positivity, 50% PR positive, and a Ki-67 of 30%. It was HER2 negative. The other area showed 99% ER positive, 30% PR positive, 10% Ki-67, and HER2 negative as well. L breast lumpectomy and SLNB (0/5) on 08/30/22   PRECAUTIONS: left UE Lymphedema risk, None  SUBJECTIVE: Pt returns for her first 3 month L-Dex screen.   PAIN:  Are you having pain? No  SOZO SCREENING: Patient was assessed today using the SOZO machine to determine the lymphedema index score. This was compared to her baseline score. It was determined that she is within the recommended range when compared to her baseline and no further action is needed at this time. She will continue SOZO screenings. These are done every 3 months for 2 years post operatively followed by every 6 months for 2 years, and then annually.   L-DEX FLOWSHEETS - 12/10/22 0800       L-DEX LYMPHEDEMA SCREENING   Measurement Type Unilateral    L-DEX MEASUREMENT EXTREMITY Upper  Extremity    POSITION  Standing    DOMINANT SIDE Right    At Risk Side Left    BASELINE SCORE (UNILATERAL) 1.6    L-DEX SCORE (UNILATERAL) 1.8    VALUE CHANGE (UNILAT) 0.2               Hermenia Bers, PTA 12/10/2022, 8:07 AM

## 2023-02-04 ENCOUNTER — Ambulatory Visit
Admission: RE | Admit: 2023-02-04 | Discharge: 2023-02-04 | Disposition: A | Discharge status: Home / Self Care | Payer: BC Managed Care – PPO | Source: Ambulatory Visit | Attending: Radiation Oncology | Admitting: Radiation Oncology

## 2023-02-04 NOTE — Progress Notes (Signed)
  Radiation Oncology         [phone] ________________________________  Name: Tanya Rangel MRN: [id number]  Date of Service: 02/04/2023  DOB: [birth year]  Post Treatment Telephone Note  Diagnosis:  Stage IA, pT1cN0M0, grade 3, ER/PR positive invasive ductal carcinoma of the left breast (as documented in provider EOT note)   The patient was available for call today.   Symptoms of fatigue have improved since completing therapy.  Symptoms of skin changes have improved since completing therapy.  The patient was encouraged to avoid sun exposure in the area of prior treatment for up to one year following radiation with either sunscreen or by the style of clothing worn in the sun.  The patient has scheduled follow up with her medical oncologist Dr. Mosetta Putt for ongoing surveillance, and was encouraged to call if she develops concerns or questions regarding radiation.   This concludes the interaction.  Ruel Favors, LPN

## 2023-02-12 ENCOUNTER — Encounter (HOSPITAL_BASED_OUTPATIENT_CLINIC_OR_DEPARTMENT_OTHER): Payer: Self-pay | Admitting: Obstetrics and Gynecology

## 2023-02-12 NOTE — Progress Notes (Signed)
Spoke w/ via phone for pre-op interview--- pt Lab needs dos----  urine preg             Lab results------ no COVID test -----patient states asymptomatic no test needed Arrive at ------- 0530 on 02-20-2023 NPO after MN NO Solid Food.  Clear liquids from MN until--- 0430 Med rec completed Medications to take morning of surgery ----- none Diabetic medication ----- n/a Patient instructed no nail polish to be worn day of surgery Patient instructed to bring photo id and insurance card day of surgery Patient aware to have Driver (ride ) / caregiver    for 24 hours after surgery --- husband, Tanya Rangel Patient Special Instructions ----- n/a Pre-Op special Instructions ----- sent inbox message to dr Richardson Dopp in epic, requested orders Patient verbalized understanding of instructions that were given at this phone interview. Patient denies shortness of breath, chest pain, fever, cough at this phone interview.

## 2023-02-19 ENCOUNTER — Other Ambulatory Visit: Payer: Self-pay | Admitting: Obstetrics and Gynecology

## 2023-02-19 DIAGNOSIS — N920 Excessive and frequent menstruation with regular cycle: Secondary | ICD-10-CM

## 2023-02-19 NOTE — H&P (Signed)
[facility]ason for Appointment   1.  Preop- Heavy Menses History of Present Illness    General:  42 y/o presents for preop visit. Pt is schedule for hysteroscopy Dilation and curettage with on 02/12/2023 for the management of menorrhargia and abnormal glandular pap smear.   IN REVIEW:  she reports heavy menses.  she had an ultrasound 07/31/2022 that revealed a uterus measuring 9.2 cm x 8.7 cm x 3.8 cm. Multiple fibroids were seen the largest is 6.1 cm.  the endometrium was 6 mm.  .seh has a history of iron deficiency anemia.  --- Patient had a pap smear performed by Dr. Jacqulyn Bath on 07/11/2022 that was AGUS high risk hpv was negative. She has a colposcopy with NP Otilio Miu on 08/21/2022 and biopsies were negative. An em biopsy was attempted but failed even with cytotec given prior to the procedure.  --- Pt has recent history of left breast cancer diagnosed 08/02/22 with invasive moderately differentiated ductal adenocarcinoma grade 2 and focal ductal carcinoma in situ. Had lumpectomy and is undergoing radiation treatment.  Current Medications Taking Iron (Ferrous Sulfate) 325 (65 Fe) MG Tablet 1 tablet Orally twice a day Multivitamin(Multiple Vitamin) - Tablet 1 tablet Orally Once a day Not-Taking Calcium 600 MG Tablet 1 tablet with meals Orally once a day Medication List reviewed and reconciled with the patient Past Medical History Mild hyperlipidemia. Iron deficiency anemia. unprovoked PE in 2020, off Xarelto, seeing hematology. Breast CA, left. Surgical History c section 2009 left breast lumpectomy 08/2022 Family History Father: alive, gout, BPH Mother: deceased, unknown health history 2 brother(s) , 3 sister(s) - healthy. 1 son(s) - healthy. Denies family history of colon cancer or breast cancer, DM, or CAD. Social History    General:  Tobacco use  cigarettes:  Never smoked, Tobacco history last updated  02/05/2023, Vaping  No. EXPOSURE TO PASSIVE SMOKE: no. Alcohol: no. Caffeine: no.  Recreational drug use: no. DIET: regular. Exercise: minimal, nothing structured. DENTAL CARE: good. Marital Status: married. Children: 1, son (s) 18 mo. EDUCATION: [employer]. 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  Periods : every 28 days, normal blood loss.  LMP 02/02/2023.  Denies H/O Birth control.  Last pap smear date 06/2022 - AGUS/HPV neg.  Last mammogram date 05/2022 - Probably Benign, 08/2022 L breast bx - grade 3 invasive ductal carcinoma.  H/O Abnormal pap smear AGUS, 2024, assessed with colposcopy, benign.  Denies H/O STD.  Menarche 10 or 11.  GYN procedures faied IVF x 3.  OB History Number of pregnancies  1.  Pregnancy # 1  C-section delivery, boy.  Allergies N.K.D.A. Hospitalization/Major Diagnostic Procedure childbirth 2009 PE 2020 Review of Systems    CONSTITUTIONAL:  Chills No.  Fatigue No.  Fever No.  Night sweats No.  Recent travel outside [place] No.  Sweats No.  Weight change No.     OPHTHALMOLOGY:  Blurring of vision no.  Change in vision no.  Double vision no.     ENT:  Dizziness no.  Nose bleeds no.  Sore throat no.  Teeth pain no.     ALLERGY:  Hives no.     CARDIOLOGY:  Chest pain no.  High blood pressure no.  Irregular heart beat no.  Leg edema no.  Palpitations no.     RESPIRATORY:  Shortness of breath no.  Cough no.  Wheezing no.     UROLOGY:  Pain with urination no.  Urinary urgency no.  Urinary  frequency no.  Urinary incontinence no.  Difficulty urinating No.  Blood in urine No.     GASTROENTEROLOGY:  Abdominal pain no.  Appetite change no.  Bloating/belching no.  Blood in stool or on toilet paper no.  Change in bowel movements no.  Constipation no.  Diarrhea no.  Difficulty swallowing no.  Nausea no.     FEMALE REPRODUCTIVE:  Vulvar pain no.  Vulvar rash no.  Abnormal vaginal bleeding no.  Breast pain no.  Nipple discharge no.  Pain with intercourse no.  Pelvic pain no.  Unusual  vaginal discharge no.  Vaginal itching no.     MUSCULOSKELETAL:  Muscle aches no.     NEUROLOGY:  Headache no.  Tingling/numbness no.  Weakness no.     PSYCHOLOGY:  Depression no.  Anxiety no.  Nervousness no.  Sleep disturbances no.  Suicidal ideation no .     ENDOCRINOLOGY:  Excessive thirst no.  Excessive urination no.  Hair loss no.  Heat or cold intolerance no.     HEMATOLOGY/LYMPH:  Abnormal bleeding no.  Easy bruising no.  Swollen glands no.     DERMATOLOGY:  New/changing skin lesion no.  Rash no.  Sores no.  Vital Signs Wt: 153.2, Wt change: -0.8 lbs, Ht: 65, BMI: 25.49, Pulse sitting: 67, BP sitting: 108/70. Examination    General Examination: CONSTITUTIONAL:  alert, oriented, NAD.  SKIN:  moist, warm.  EYES:  Conjunctiva clear.  LUNGS:  good I:E efffort noted, clear to auscultation bilaterally.  HEART:  regular rate and rhythm.  ABDOMEN:  soft, non-tender/non-distended, bowel sounds present.  FEMALE GENITOURINARY:  normal external genitalia, labia - unremarkable, vagina - pink moist mucosa, no lesions or abnormal discharge..moderate amount of blood in the vaginal vault , cervix - no discharge or lesions or CMT, adnexa - no masses or tenderness, uterus - nontender and normal size on palpation.  EXTREMITIES:  no edema present.  PSYCH:  affect normal, good eye contact.  Physical Examination    Chaperone present:  Chaperone present  Debby Freiberg 02/05/2023 03:40:24 PM >, for pelvic exam.  Assessments 1. Pap smear abnormality of vagina, abnormal glandular cells - R87.629 (Primary)    2. Menorrhagia with regular cycle - N92.0    Treatment 1. Pap smear abnormality of vagina, abnormal glandular cells        Notes: planning hysteroscopy Dilation and Curettage to rule out hyperplasia or malignancy... as the endometrial biopsy could not be perfomed in the office. R/B/A of hysteroscopy D&C were discussed with the patient including but not limited to infecton / bleeding / possible  perforation of the uterus with the need for further surgery. she voiced understanding and desires to proceed. she is advised to avoid eating or drinking after midnight the night prior to surgery. she should avoid NSAID 2 week prior to surgery.    2. Menorrhagia with regular cycle        Notes: she has decided she does not want hydrothermal ablation. SHe will let me know if she changes her mind.

## 2023-02-19 NOTE — H&P (Deleted)
  The note originally documented on this encounter has been moved the the encounter in which it belongs.  

## 2023-02-19 NOTE — Anesthesia Preprocedure Evaluation (Signed)
Anesthesia Evaluation  Patient identified by MRN, date of birth, ID band Patient awake    Reviewed: Allergy & Precautions, H&P , NPO status , Patient's Chart, lab work & pertinent test results  Airway Mallampati: II  TM Distance: >3 FB Neck ROM: Full    Dental no notable dental hx. (+) Dental Advisory Given, Teeth Intact   Pulmonary PE (april 2020, s/p xarelto x 5mo)   Pulmonary exam normal breath sounds clear to auscultation       Cardiovascular negative cardio ROS Normal cardiovascular exam Rhythm:Regular Rate:Normal     Neuro/Psych negative neurological ROS  negative psych ROS   GI/Hepatic negative GI ROS, Neg liver ROS,,,  Endo/Other  negative endocrine ROS    Renal/GU negative Renal ROS     Musculoskeletal negative musculoskeletal ROS (+)    Abdominal   Peds  Hematology  (+) Blood dyscrasia, anemia Hb 11.5, plt 352   Anesthesia Other Findings L breast ca   Reproductive/Obstetrics negative OB ROS                             Anesthesia Physical Anesthesia Plan  ASA: 2  Anesthesia Plan: General   Post-op Pain Management: Tylenol PO (pre-op)* and Toradol IV (intra-op)*   Induction: Intravenous  PONV Risk Score and Plan: 3 and Ondansetron, Midazolam, Treatment may vary due to age or medical condition and Scopolamine patch - Pre-op  Airway Management Planned: LMA  Additional Equipment: None  Intra-op Plan:   Post-operative Plan: Extubation in OR  Informed Consent: I have reviewed the patients History and Physical, chart, labs and discussed the procedure including the risks, benefits and alternatives for the proposed anesthesia with the patient or authorized representative who has indicated his/her understanding and acceptance.     Dental advisory given  Plan Discussed with: CRNA  Anesthesia Plan Comments: (Pt refused decadron)       Anesthesia Quick Evaluation

## 2023-02-20 ENCOUNTER — Ambulatory Visit (HOSPITAL_BASED_OUTPATIENT_CLINIC_OR_DEPARTMENT_OTHER): Payer: Self-pay | Admitting: Anesthesiology

## 2023-02-20 ENCOUNTER — Encounter (HOSPITAL_BASED_OUTPATIENT_CLINIC_OR_DEPARTMENT_OTHER)
Admission: RE | Disposition: A | Discharge status: Home / Self Care | Payer: Self-pay | Source: Home / Self Care | Attending: Obstetrics and Gynecology

## 2023-02-20 ENCOUNTER — Other Ambulatory Visit: Payer: Self-pay

## 2023-02-20 ENCOUNTER — Encounter (HOSPITAL_BASED_OUTPATIENT_CLINIC_OR_DEPARTMENT_OTHER): Payer: Self-pay | Admitting: Obstetrics and Gynecology

## 2023-02-20 ENCOUNTER — Ambulatory Visit (HOSPITAL_BASED_OUTPATIENT_CLINIC_OR_DEPARTMENT_OTHER)
Admission: RE | Admit: 2023-02-20 | Discharge: 2023-02-20 | Disposition: A | Discharge status: Home / Self Care | Payer: BC Managed Care – PPO | Attending: Obstetrics and Gynecology | Admitting: Obstetrics and Gynecology

## 2023-02-20 DIAGNOSIS — N920 Excessive and frequent menstruation with regular cycle: Secondary | ICD-10-CM | POA: Insufficient documentation

## 2023-02-20 DIAGNOSIS — D250 Submucous leiomyoma of uterus: Secondary | ICD-10-CM | POA: Insufficient documentation

## 2023-02-20 DIAGNOSIS — Z86711 Personal history of pulmonary embolism: Secondary | ICD-10-CM | POA: Diagnosis not present

## 2023-02-20 DIAGNOSIS — D649 Anemia, unspecified: Secondary | ICD-10-CM | POA: Insufficient documentation

## 2023-02-20 DIAGNOSIS — C50919 Malignant neoplasm of unspecified site of unspecified female breast: Secondary | ICD-10-CM | POA: Diagnosis not present

## 2023-02-20 DIAGNOSIS — D759 Disease of blood and blood-forming organs, unspecified: Secondary | ICD-10-CM | POA: Diagnosis not present

## 2023-02-20 DIAGNOSIS — Z01818 Encounter for other preprocedural examination: Secondary | ICD-10-CM

## 2023-02-20 HISTORY — DX: Personal history of irradiation: Z92.3

## 2023-02-20 HISTORY — DX: Excessive and frequent menstruation with regular cycle: N92.0

## 2023-02-20 HISTORY — DX: Presence of spectacles and contact lenses: Z97.3

## 2023-02-20 HISTORY — PX: HYSTEROSCOPY WITH D & C: SHX1775

## 2023-02-20 HISTORY — DX: Iron deficiency anemia, unspecified: D50.9

## 2023-02-20 LAB — CBC
HCT: 35.7 % — ABNORMAL LOW (ref 36.0–46.0)
Hemoglobin: 11.2 g/dL — ABNORMAL LOW (ref 12.0–15.0)
MCH: 29.4 pg (ref 26.0–34.0)
MCHC: 31.4 g/dL (ref 30.0–36.0)
MCV: 93.7 fL (ref 80.0–100.0)
Platelets: 262 10*3/uL (ref 150–400)
RBC: 3.81 MIL/uL — ABNORMAL LOW (ref 3.87–5.11)
RDW: 13.2 % (ref 11.5–15.5)
WBC: 2.7 10*3/uL — ABNORMAL LOW (ref 4.0–10.5)
nRBC: 0 % (ref 0.0–0.2)

## 2023-02-20 LAB — POCT PREGNANCY, URINE: Preg Test, Ur: NEGATIVE

## 2023-02-20 SURGERY — DILATATION AND CURETTAGE /HYSTEROSCOPY
Anesthesia: General | Site: Vagina

## 2023-02-20 MED ORDER — ACETAMINOPHEN 500 MG PO TABS
ORAL_TABLET | ORAL | Status: AC
  Filled 2023-02-20: qty 2

## 2023-02-20 MED ORDER — ONDANSETRON HCL 4 MG/2ML IJ SOLN
INTRAMUSCULAR | Status: DC | PRN
Start: 2023-02-20 — End: 2023-02-20
  Administered 2023-02-20: 4 mg via INTRAVENOUS

## 2023-02-20 MED ORDER — MIDAZOLAM HCL 2 MG/2ML IJ SOLN
INTRAMUSCULAR | Status: AC
  Filled 2023-02-20: qty 2

## 2023-02-20 MED ORDER — POVIDONE-IODINE 10 % EX SWAB: 2.0000 | Freq: Once | CUTANEOUS | Status: DC

## 2023-02-20 MED ORDER — OXYCODONE HCL 5 MG PO TABS: 5.0000 mg | ORAL_TABLET | Freq: Four times a day (QID) | ORAL | 0 refills | Status: DC | PRN

## 2023-02-20 MED ORDER — SCOPOLAMINE 1 MG/3DAYS TD PT72
1.0000 | MEDICATED_PATCH | TRANSDERMAL | Status: DC
  Administered 2023-02-20: 1.5 mg via TRANSDERMAL

## 2023-02-20 MED ORDER — ONDANSETRON HCL 4 MG/2ML IJ SOLN
INTRAMUSCULAR | Status: AC
  Filled 2023-02-20: qty 2

## 2023-02-20 MED ORDER — IBUPROFEN 800 MG PO TABS: 800.0000 mg | ORAL_TABLET | Freq: Three times a day (TID) | ORAL | 0 refills | Status: AC | PRN

## 2023-02-20 MED ORDER — SCOPOLAMINE 1 MG/3DAYS TD PT72
MEDICATED_PATCH | TRANSDERMAL | Status: AC
  Filled 2023-02-20: qty 1

## 2023-02-20 MED ORDER — PROPOFOL 10 MG/ML IV BOLUS
INTRAVENOUS | Status: DC | PRN
  Administered 2023-02-20: 180 mg via INTRAVENOUS

## 2023-02-20 MED ORDER — PROMETHAZINE HCL 25 MG/ML IJ SOLN: 6.2500 mg | INTRAMUSCULAR | Status: DC | PRN

## 2023-02-20 MED ORDER — FENTANYL CITRATE (PF) 100 MCG/2ML IJ SOLN: 25.0000 ug | INTRAMUSCULAR | Status: DC | PRN

## 2023-02-20 MED ORDER — FENTANYL CITRATE (PF) 100 MCG/2ML IJ SOLN
INTRAMUSCULAR | Status: DC | PRN
  Administered 2023-02-20: 25 ug via INTRAVENOUS
  Administered 2023-02-20: 50 ug via INTRAVENOUS
  Administered 2023-02-20: 25 ug via INTRAVENOUS

## 2023-02-20 MED ORDER — LIDOCAINE 2% (20 MG/ML) 5 ML SYRINGE
INTRAMUSCULAR | Status: DC | PRN
  Administered 2023-02-20: 80 mg via INTRAVENOUS

## 2023-02-20 MED ORDER — PROPOFOL 10 MG/ML IV BOLUS
INTRAVENOUS | Status: AC
  Filled 2023-02-20: qty 20

## 2023-02-20 MED ORDER — MEPERIDINE HCL 25 MG/ML IJ SOLN: 6.2500 mg | INTRAMUSCULAR | Status: DC | PRN

## 2023-02-20 MED ORDER — MIDAZOLAM HCL 5 MG/5ML IJ SOLN
INTRAMUSCULAR | Status: DC | PRN
  Administered 2023-02-20: 2 mg via INTRAVENOUS

## 2023-02-20 MED ORDER — FENTANYL CITRATE (PF) 100 MCG/2ML IJ SOLN
INTRAMUSCULAR | Status: AC
  Filled 2023-02-20: qty 2

## 2023-02-20 MED ORDER — LACTATED RINGERS IV SOLN: INTRAVENOUS | Status: DC

## 2023-02-20 MED ORDER — DEXAMETHASONE SODIUM PHOSPHATE 10 MG/ML IJ SOLN
INTRAMUSCULAR | Status: AC
  Filled 2023-02-20: qty 1

## 2023-02-20 MED ORDER — KETOROLAC TROMETHAMINE 30 MG/ML IJ SOLN
INTRAMUSCULAR | Status: DC | PRN
  Administered 2023-02-20: 30 mg via INTRAVENOUS

## 2023-02-20 MED ORDER — SODIUM CHLORIDE 0.9 % IR SOLN
Status: DC | PRN
  Administered 2023-02-20: 3000 mL

## 2023-02-20 MED ORDER — BUPIVACAINE HCL (PF) 0.25 % IJ SOLN
INTRAMUSCULAR | Status: DC | PRN
  Administered 2023-02-20: 20 mL

## 2023-02-20 MED ORDER — LIDOCAINE HCL (PF) 2 % IJ SOLN
INTRAMUSCULAR | Status: AC
  Filled 2023-02-20: qty 5

## 2023-02-20 MED ORDER — ACETAMINOPHEN 500 MG PO TABS
1000.0000 mg | ORAL_TABLET | ORAL | Status: AC
  Administered 2023-02-20: 1000 mg via ORAL

## 2023-02-20 MED ORDER — KETOROLAC TROMETHAMINE 30 MG/ML IJ SOLN
INTRAMUSCULAR | Status: AC
  Filled 2023-02-20: qty 1

## 2023-02-20 MED ORDER — DROPERIDOL 2.5 MG/ML IJ SOLN: 0.6250 mg | Freq: Once | INTRAMUSCULAR | Status: DC | PRN

## 2023-02-20 SURGICAL SUPPLY — 16 items
CATH ROBINSON RED A/P 16FR (CATHETERS) IMPLANT
DEVICE MYOSURE LITE (MISCELLANEOUS) IMPLANT
DEVICE MYOSURE REACH (MISCELLANEOUS) IMPLANT
DRSG TELFA 3X8 NADH STRL (GAUZE/BANDAGES/DRESSINGS) ×1 IMPLANT
GAUZE 4X4 16PLY ~~LOC~~+RFID DBL (SPONGE) ×1 IMPLANT
GLOVE BIOGEL M 6.5 STRL (GLOVE) ×1 IMPLANT
GLOVE BIOGEL M 7.0 STRL (GLOVE) ×1 IMPLANT
GLOVE BIOGEL PI IND STRL 6.5 (GLOVE) ×2 IMPLANT
GLOVE BIOGEL PI IND STRL 7.0 (GLOVE) ×1 IMPLANT
GOWN STRL REUS W/TWL LRG LVL3 (GOWN DISPOSABLE) ×2 IMPLANT
KIT PROCEDURE FLUENT (KITS) ×1 IMPLANT
KIT TURNOVER CYSTO (KITS) ×1 IMPLANT
PACK VAGINAL MINOR WOMEN LF (CUSTOM PROCEDURE TRAY) ×1 IMPLANT
PAD OB MATERNITY 4.3X12.25 (PERSONAL CARE ITEMS) ×1 IMPLANT
SEAL ROD LENS SCOPE MYOSURE (ABLATOR) ×1 IMPLANT
SLEEVE SCD COMPRESS KNEE MED (STOCKING) ×1 IMPLANT

## 2023-02-20 NOTE — Anesthesia Procedure Notes (Signed)
Procedure Name: LMA Insertion Date/Time: 02/20/2023 7:45 AM  Performed by: Amity Roes D, CRNAPre-anesthesia Checklist: Patient identified, Emergency Drugs available, Suction available and Patient being monitored Patient Re-evaluated:Patient Re-evaluated prior to induction Oxygen Delivery Method: Circle system utilized Preoxygenation: Pre-oxygenation with 100% oxygen Induction Type: IV induction Ventilation: Mask ventilation without difficulty LMA: LMA inserted LMA Size: 4.0 Tube type: Oral Number of attempts: 1 Placement Confirmation: positive ETCO2 and breath sounds checked- equal and bilateral Tube secured with: Tape Dental Injury: Teeth and Oropharynx as per pre-operative assessment

## 2023-02-20 NOTE — Anesthesia Postprocedure Evaluation (Signed)
Anesthesia Post Note  Patient: Tanya Rangel  Procedure(s) Performed: DILATATION AND CURETTAGE /HYSTEROSCOPY (Vagina )     Patient location during evaluation: PACU Anesthesia Type: General Level of consciousness: sedated and patient cooperative Pain management: pain level controlled Vital Signs Assessment: post-procedure vital signs reviewed and stable Respiratory status: spontaneous breathing Cardiovascular status: stable Anesthetic complications: no   No notable events documented.  Last Vitals:  Vitals:   02/20/23 0845 02/20/23 0905  BP: 109/68 100/63  Pulse: 61 (!) 57  Resp: 10 15  Temp:  36.4 C  SpO2: 100% 100%    Last Pain:  Vitals:   02/20/23 0905  TempSrc:   PainSc: 0-No pain                 [name]

## 2023-02-20 NOTE — H&P (Signed)
Date of Initial H&P: 02/19/2023  History reviewed, patient examined, no change in status, stable for surgery.

## 2023-02-20 NOTE — Transfer of Care (Signed)
Immediate Anesthesia Transfer of Care Note  Patient: Tanya Rangel  Procedure(s) Performed: DILATATION AND CURETTAGE /HYSTEROSCOPY (Vagina )  Patient Location: PACU  Anesthesia Type:General  Level of Consciousness: awake, alert , and oriented  Airway & Oxygen Therapy: Patient Spontanous Breathing and Patient connected to nasal cannula oxygen  Post-op Assessment: Report given to RN and Post -op Vital signs reviewed and stable  Post vital signs: Reviewed and stable  Last Vitals:  Vitals Value Taken Time  BP 114/64 02/20/23 0818  Temp 36.9 C 02/20/23 0818  Pulse 83 02/20/23 0818  Resp 9 02/20/23 0818  SpO2 100 % 02/20/23 0818    Last Pain:  Vitals:   02/20/23 0628  TempSrc: Oral  PainSc: 0-No pain      Patients Stated Pain Goal: 7 (02/20/23 1610)  Complications: No notable events documented.

## 2023-02-20 NOTE — Op Note (Signed)
02/20/2023  8:16 AM  PATIENT:  Tanya Rangel  42 y.o. female  PRE-OPERATIVE DIAGNOSIS:  Abnormal Glandular Pap Smear of Vagina, Menorrhagia with Regular Cycle  POST-OPERATIVE DIAGNOSIS:  Abnormal Glandular Pap Smear of Vagina, Menorrhagia with Regular Cycle  PROCEDURE:  Procedure(s): DILATATION AND CURETTAGE /HYSTEROSCOPY (N/A)  SURGEON:  Surgeons and Role:    Gerald Leitz, MD - Primary  PHYSICIAN ASSISTANT:   ASSISTANTS: none   ANESTHESIA:   general  EBL:  less than 5 mL   BLOOD ADMINISTERED:none  DRAINS: none   LOCAL MEDICATIONS USED:  MARCAINE     SPECIMEN:  Source of Specimen:  endometrial curettings  DISPOSITION OF SPECIMEN:  PATHOLOGY  COUNTS:  YES  TOURNIQUET:  * No tourniquets in log *  DICTATION: .Note written in EPIC  PLAN OF CARE: Discharge to home after PACU  PATIENT DISPOSITION:  PACU - hemodynamically stable.   Delay start of Pharmacological VTE agent (>24hrs) due to surgical blood loss or risk of bleeding: not applicable  Findings: normal external genitalia. The endocervical canal is slightly distorted leading to the endometrial canal. Sampling in the office was most likely unsuccessful due to the distorted endocervical canal. Large posterior submucosal fibroid filling the majority of the endometrial cavity. The endometrium appears secretory.   Procedure: Patient was taken to the operating room where she was placed under general anesthesia. She was placed in the dorsal lithotomy position. She was prepped and draped in the usual sterile fashion. A speculum was placed into the vaginal vault. The anterior lip of the cervix was grasped with a single-tooth tenaculum. Quarter percent Marcaine was injected at the 4 and 8:00 positions of the cervix. The cervix was then sounded to 8 cm. The cervix was dilated to approximately 6 mm. Mysosure operative  hysteroscope was inserted. The findings noted above. Myosure reach  blade was introduced throught the  hysteroscope in order to obtain endometrial curettings. The reach blade was used to insure proper sampling given the distorted endometrial canal.  There was no evidence of perforation. Hysteroscope was then removed.  The single-tooth tenaculum was removed from the anterior lip of the cervix. Hemostasis was noted. The speculum was removed from the patient's vagina. She was awakened from anesthesia taken care  To the recovery  room awake and in stable condition. Sponge lap and needle counts were correct x2.

## 2023-02-20 NOTE — Discharge Instructions (Addendum)
  Post Anesthesia Home Care Instructions  Activity: Get plenty of rest for the remainder of the day. A responsible individual must stay with you for 24 hours following the procedure.  For the next 24 hours, DO NOT: -Drive a car -Advertising copywriter -Drink alcoholic beverages -Take any medication unless instructed by your physician -Make any legal decisions or sign important papers.  Meals: Start with liquid foods such as gelatin or soup. Progress to regular foods as tolerated. Avoid greasy, spicy, heavy foods. If nausea and/or vomiting occur, drink only clear liquids until the nausea and/or vomiting subsides. Call your physician if vomiting continues.  Special Instructions/Symptoms: Your throat may feel dry or sore from the anesthesia or the breathing tube placed in your throat during surgery. If this causes discomfort, gargle with warm salt water. The discomfort should disappear within 24 hours.  If you had a scopolamine patch placed behind your ear for the management of post- operative nausea and/or vomiting:  1. The medication in the patch is effective for 72 hours, after which it should be removed.  Wrap patch in a tissue and discard in the trash. Wash hands thoroughly with soap and water. 2. You may remove the patch earlier than 72 hours if you experience unpleasant side effects which may include dry mouth, dizziness or visual disturbances. 3. Avoid touching the patch. Wash your hands with soap and water after contact with the patch. Please remove patch by Saturday 02/23/23    No acetaminophen/Tylenol until after 12:30 pm today if needed. No ibuprofen, Advil, Aleve, Motrin, ketorolac, meloxicam, naproxen, or other NSAIDS until after 2 pm today if needed.    D & C Home care Instructions:   Personal hygiene:  Used sanitary napkins for vaginal drainage not tampons. Shower or tub bathe the day after your procedure. No douching until bleeding stops. Always wipe from front to back after   Elimination.  Activity: Do not drive or operate any equipment today. The effects of the anesthesia are still present and drowsiness may result. Rest today, not necessarily flat bed rest, just take it easy. You may resume your normal activity in one to 2 days.  Sexual activity: No intercourse for one week or as indicated by your physician  Diet: Eat a light diet as desired this evening. You may resume a regular diet tomorrow.  Return to work: One to 2 days.  General Expectations of your surgery: Vaginal bleeding should be no heavier than a normal period. Spotting may continue up to 10 days. Mild cramps may continue for a couple of days. You may have a regular period in 2-6 weeks.  Unexpected observations call your doctor if these occur: persistent or heavy bleeding. Severe abdominal cramping or pain. Elevation of temperature greater than 100F. Inability to urinate.  Call for an appointment in one to four weeks.

## 2023-02-22 ENCOUNTER — Other Ambulatory Visit: Payer: Self-pay | Admitting: Hematology

## 2023-02-22 ENCOUNTER — Encounter (HOSPITAL_BASED_OUTPATIENT_CLINIC_OR_DEPARTMENT_OTHER): Payer: Self-pay | Admitting: Obstetrics and Gynecology

## 2023-02-27 ENCOUNTER — Inpatient Hospital Stay: Payer: BC Managed Care – PPO | Attending: Hematology | Admitting: Nurse Practitioner

## 2023-02-27 ENCOUNTER — Encounter: Payer: Self-pay | Admitting: Nurse Practitioner

## 2023-02-27 VITALS — BP 117/80 | HR 62 | Temp 98.4°F | Resp 16 | Wt 154.5 lb

## 2023-02-27 DIAGNOSIS — Z170 Estrogen receptor positive status [ER+]: Secondary | ICD-10-CM | POA: Diagnosis not present

## 2023-02-27 DIAGNOSIS — C50412 Malignant neoplasm of upper-outer quadrant of left female breast: Secondary | ICD-10-CM | POA: Diagnosis not present

## 2023-02-27 DIAGNOSIS — Z923 Personal history of irradiation: Secondary | ICD-10-CM | POA: Diagnosis not present

## 2023-02-27 NOTE — Progress Notes (Signed)
CLINIC:  Survivorship   Patient Care Team: Maurice Small, MD (Inactive) as PCP - General (Family Medicine) Pollyann Samples, NP as Nurse Practitioner (Hematology and Oncology) Pershing Proud, RN as Oncology Nurse Navigator Donnelly Angelica, RN as Oncology Nurse Navigator Malachy Mood, MD as Consulting Physician (Hematology)   REASON FOR VISIT:  Routine follow-up post-treatment for a recent history of breast cancer.  BRIEF ONCOLOGIC HISTORY:  Oncology History  Primary malignant neoplasm of upper outer quadrant of left breast (HCC)  07/25/2022 Imaging   Mammo/US IMPRESSION: 1. Irregular 1.2 cm mass in left breast at 2 o'clock, 5 cm the nipple, corresponding to the palpable lump, highly suspicious breast malignancy. 2. Small group calcifications in the posterior, lateral left breast, without change compared to prior exams dating back to 0/24/2022. Given the stability, these are likely to be benign calcifications, but have some increased suspicion due to adjacent palpable.   08/02/2022 Initial Biopsy   1. Breast, left, needle core biopsy, 2 o'clock, 5cmfn (ribbon) INVASIVE MODERATELY DIFFERENTIATED DUCTAL ADENOCARCINOMA, GRADE 2 (3+2+1) FOCAL DUCTAL CARCINOMA IN SITU, INTERMEDIATE NUCLEAR GRADE, SOLID TYPE WITHOUT NECROSIS TUBULE FORMATION: SCORE 3 NUCLEAR PLEOMORPHISM: SCORE 2 MITOTIC COUNT: SCORE 1 TOTAL SCORE: 6 OF 9 OVERALL GRADE: GRADE 2 NEGATIVE FOR LYMPHOVASCULAR INVASION NEGATIVE FOR MICROCALCIFICATIONS INVASIVE TUMOR MEASURES 6 MM IN GREATEST LINEAR EXTENT  1. Breast, left, needle core biopsy, 2 o'clock, 5 cmfn (ribbon) PROGNOSTIC INDICATORS  The tumor cells are equivocal for Her2 (2+). Her2 by FISH will be performed and the results reported separately. Estrogen Receptor: 95%, POSITIVE, MODERATE-STRONG STAINING INTENSITY Progesterone Receptor: 50%, POSITIVE, MODERATE-STRONG STAINING INTENSITY Proliferation Marker Ki67: 30%  1. Breast,left, needle core  biopsy,2.o'clock,5 cmfn (ribbon) FLUORESCENCE IN-SITU HYBRIDIZATION Results: GROUP 5: HER2 **NEGATIVE**  2. Breast, left, needle core biopsy, UOQ (coil) INVASIVE MODERATELY DIFFERENTIATED DUCTAL ADENOCARCINOMA, GRADE 2 (3+2+1) FOCAL DUCTAL CARCINOMA IN SITU, INTERMEDIATE NUCLEAR GRADE NECROSIS TUBULE FORMATION: SCORE 3 NUCLEAR PLEOMORPHISM: SCORE 2 MITOTIC COUNT: SCORE 1 TOTAL SCORE: 6 OF 9 OVERALL GRADE: GRADE 2 MICROCALCIFICATIONS PRESENT WITHIN PERITUMORAL STROMA, DCIS AND COLUMNAR CELL CHANGE NEGATIVE FOR ANGIOLYMPHATIC INVASION BACKGROUND FIBROCYSTIC CHANGES INCLUDING STROMAL FIBROSIS, CYSTIC DILATATION OF DUCTS, APOCRINE METAPLASIA AND COLUMNAR CELL CHANGE TUMOR MEASURES 1.25 MM IN GREATEST LINEAR EXTENT  2. Breast, left, needle core biopsy, UOQ (coil) PROGNOSTIC INDICATORS  The tumor cells are equivocal for Her2 (2+). Her2 by FISH will be performed and the results reported separately. Estrogen Receptor: 99%, POSITIVE, STRONG STAINING INTENSITY Progesterone Receptor: 30%, POSITIVE, WEAK-MODERATE STAINING INTENSITY Proliferation Marker Ki67: 10%  2. Breast, left, needle core biopsy, UOQ(coil) FLUORESCENCE IN-SITU HYBRIDIZATION Results: GROUP 5: HER2 **NEGATIVE**     08/15/2022 Initial Diagnosis   Primary malignant neoplasm of upper outer quadrant of left breast (HCC)   08/15/2022 Cancer Staging   Staging form: Breast, AJCC 8th Edition - Clinical stage from 08/15/2022: Stage IA (cT1c, cN0, cM0, G2, ER+, PR+, HER2-) - Signed by Ronny Bacon, PA-C on 08/15/2022 Stage prefix: Initial diagnosis Method of lymph node assessment: Clinical Histologic grading system: 3 grade system    Genetic Testing   [facility] Custom Panel+RNA was Negative. Report date is 08/24/2022.  The Custom Hereditary Cancers Panel offered by [employer] includes sequencing and/or deletion duplication testing of the following 43 genes: APC, ATM, AXIN2, BAP1, BARD1, BMPR1A, BRCA1, BRCA2, BRIP1, CDH1,  CDK4, CDKN2A (p14ARF and p16INK4a only), CHEK2, CTNNA1, EPCAM (Deletion/duplication testing only), FH, GREM1 (promoter region duplication testing only), HOXB13, KIT, MBD4, MEN1, MLH1, MSH2, MSH3, MSH6, MUTYH, NF1, NHTL1, PALB2,  PDGFRA, PMS2, POLD1, POLE, PTEN, RAD51C, RAD51D, SMAD4, SMARCA4. STK11, TP53, TSC1, TSC2, and VHL.    08/30/2022 Cancer Staging   Staging form: Breast, AJCC 8th Edition - Pathologic stage from 08/30/2022: Stage IA (pT1c, pN0, cM0, G3, ER+, PR+, HER2-, Oncotype DX score: 13) - Signed by Malachy Mood, MD on 11/25/2022 Stage prefix: Initial diagnosis Nuclear grade: G3 Multigene prognostic tests performed: Oncotype DX Recurrence score range: Greater than or equal to 11 Histologic grading system: 3 grade system Residual tumor (R): R0 - None   10/07/2022 - 11/27/2022 Radiation Therapy   Plan Name: Breast_L_BH Site: Breast, Left Dose Per Fraction: 2 Gy Prescribed Dose (Delivered / Prescribed): 50 Gy / 50 Gy Prescribed Fxs (Delivered / Prescribed): 25 / 25   Plan Name: Brst_L_Bst_BH Site: Breast, Left Dose Per Fraction: 1.8 Gy Prescribed Dose (Delivered / Prescribed): 14.4 Gy / 14.4 Gy Prescribed Fxs (Delivered / Prescribed): 8 / 8   12/2022 -  Anti-estrogen oral therapy   Recommended anti-estrogen, Tamoxifen prescribed but pt opted not to take it   02/27/2023 Survivorship   SCP delivered by Santiago Glad, NP     INTERVAL HISTORY:  Ms. Hedgepeth presents to the Survivorship Clinic today for our initial meeting to review her survivorship care plan detailing her treatment course for breast cancer, as well as monitoring long-term side effects of that treatment, education regarding health maintenance, screening, and overall wellness and health promotion.     Overall, Ms. Watts reports feeling well, she has recovered from radiation. She did not start Tamoxifen, her intuition told her not to after much thought and prayer. Tolerating oral iron, constipation resolved.     REVIEW OF  SYSTEMS:  Review of Systems - Oncology Breast: Denies any new nodularity, masses, tenderness, nipple changes, or nipple discharge.      ONCOLOGY TREATMENT TEAM:  1. Surgeon:  Dr. Magnus Ivan at [facility] 2. Medical Oncologist: Dr. Mosetta Putt  3. Radiation Oncologist: Dr. Mitzi Hansen    PAST MEDICAL/SURGICAL HISTORY:  Past Medical History:  Diagnosis Date   Abnormal Pap smear of vagina 09/2022   History of external beam radiation therapy    left breast cancer  09-25-2022  to 11-27-2022   History of pulmonary embolus (PE) 09/2018   ED 10-10-2018  found to have RLL small segmental emboli, unprovoked ;   completed 6 months xarelto;   had work-up done by hematologist --- dr Minta Balsam higss,  negative, (02-12-2023  pt had never had a clot prior to this episode and none since)   Hx of abnormal cervical Pap smear    IDA (iron deficiency anemia)    followed by hematology/ onologist--- dr Mosetta Putt, secondary to menorrhagia,   takes oral iron,  has had IV iron   Malignant neoplasm of upper-outer quadrant of left breast in female, estrogen receptor positive (HCC) 08/15/2022   oncologist--- dr Mosetta Putt;   dx 08-15-2022;   08-30-2022 s/p  left breast lumpectomy node dissection's,  IDC/  DCIS,  Stage IA, G3 ER/PR+,  nodes were negative;   radiation completed 11-27-2022   Menorrhagia with regular cycle    Wears glasses    Past Surgical History:  Procedure Laterality Date   BREAST BIOPSY Left 08/02/2022   [place] LT BREAST BX W LOC DEV 1ST LESION IMG BX SPEC US GUIDE 08/02/2022 [facility]   BREAST BIOPSY Left 08/02/2022   MM LT BREAST BX W LOC DEV 1ST LESION IMAGE BX SPEC STEREO GUIDE 08/02/2022 [facility]   BREAST BIOPSY  08/29/2022  MM LT RADIOACTIVE SEED LOC MAMMO GUIDE 08/29/2022 [facility]   BREAST BIOPSY  08/29/2022   MM LT RADIOACTIVE SEED EA ADD LESION LOC MAMMO GUIDE 08/29/2022 [facility]   BREAST LUMPECTOMY WITH RADIOACTIVE SEED AND SENTINEL LYMPH NODE BIOPSY Left 08/30/2022    Procedure: LEFT BREAST LUMPECTOMY WITH RADIOACTIVE SEED X2 AND SENTINEL LYMPH NODE BIOPSY;  Surgeon: Abigail Miyamoto, MD;  Location: [facility] OR;  Service: General;  Laterality: Left;   CESAREAN SECTION  11/13/2007   @[facility]    HYSTEROSCOPY WITH D & C N/A 02/20/2023   Procedure: DILATATION AND CURETTAGE /HYSTEROSCOPY;  Surgeon: Gerald Leitz, MD;  Location: [facility];  Service: Gynecology;  Laterality: N/A;     ALLERGIES:  No Known Allergies   CURRENT MEDICATIONS:  Outpatient Encounter Medications as of 02/27/2023  Medication Sig Note   cholecalciferol (VITAMIN D3) 25 MCG (1000 UNIT) tablet Take 1,000 Units by mouth daily.    ferrous sulfate 325 (65 FE) MG tablet TAKE 1 TABLET BY MOUTH EVERY DAY WITH BREAKFAST (Patient taking differently: Take 325 mg by mouth daily with breakfast.)    ibuprofen (ADVIL) 800 MG tablet Take 1 tablet (800 mg total) by mouth every 8 (eight) hours as needed.    Multiple Vitamin (MULTIVITAMIN) capsule Take 1 capsule by mouth daily.    [DISCONTINUED] oxyCODONE (OXY IR/ROXICODONE) 5 MG immediate release tablet Take 1 tablet (5 mg total) by mouth every 6 (six) hours as needed for severe pain.    [DISCONTINUED] tamoxifen (NOLVADEX) 20 MG tablet TAKE 1 TABLET BY MOUTH EVERY DAY (Patient not taking: Reported on 02/27/2023) 02/27/2023: pt declined   [DISCONTINUED] traMADol (ULTRAM) 50 MG tablet Take 1 tablet (50 mg total) by mouth every 6 (six) hours as needed for moderate pain or severe pain.    No facility-administered encounter medications on file as of 02/27/2023.     ONCOLOGIC FAMILY HISTORY:  Family History  Problem Relation Age of Onset   Thrombosis Neg Hx      GENETIC COUNSELING/TESTING: Yes, negative   SOCIAL HISTORY:  Brihana D Remigio is currently working.  She denies any current or history of tobacco, alcohol, or illicit drug use.     PHYSICAL EXAMINATION:  Vital Signs:   Vitals:   02/27/23 0936  BP: 117/80  Pulse: 62  Resp: 16  Temp: 98.4  F (36.9 C)  SpO2: 100%   Filed Weights   02/27/23 0936  Weight: 154 lb 8 oz (70.1 kg)   General: Well-nourished, well-appearing female in no acute distress.   HEENT: Sclerae anicteric. Respiratory: breathing non-labored.  Neuro: Steady gait.  Psych: Mood and affect normal and appropriate for situation.  Extremities: No edema.   LABORATORY DATA:  None for this visit.  DIAGNOSTIC IMAGING:  None for this visit.      ASSESSMENT AND PLAN:  Ms.. Kerchner is a pleasant 42 y.o. female with Stage IA left breast invasive ductal carcinoma, ER+/PR+/HER2-, diagnosed in 07/2022, treated with lumpectomy, adjuvant radiation therapy, and declined anti-estrogen therapy.  She presents to the Survivorship Clinic for our initial meeting and routine follow-up post-completion of treatment for breast cancer.    1. Stage IA left breast cancer:  Ms. Breckon has recovered well from definitive treatment for breast cancer.  She opted not to take tamoxifen after much thought and prayer.  She understands the potential benefit of tamoxifen can reduce the risk of future/distant recurrence by up to 40-50%.  She understands her recurrence risk is elevated GYN.  She previously declined  ovarian suppression and AI.  She prefers to live a healthy active lifestyle as risk reduction.  She prefers to follow-up with PCP for routine age-appropriate health care and OB/GYN for mammograms.  She knows to call us in the future if needed. Today, a comprehensive survivorship care plan and treatment summary was reviewed with the patient today detailing her breast cancer diagnosis, treatment course, potential late/long-term effects of treatment, appropriate follow-up care with recommendations for the future, and patient education resources.  A copy of this summary, along with a letter will be sent to the patient's primary care provider via mail/fax/In Basket message after today's visit.    2. Bone health:  Given Ms. Kosch's  premenopausal status and that she is not on antiestrogen, she can begin DEXA screening when she becomes postmenopausal.  In the meantime I encouraged her to take calcium, vitamin D, and engage in weightbearing exercise for bone health   3. Cancer screening:  Due to Ms. Arduini's history and her age, she should receive screening for skin cancers, colon cancer, and gynecologic cancers.  The information and recommendations are listed on the patient's comprehensive care plan/treatment summary and were reviewed in detail with the patient.    4. Health maintenance and wellness promotion: Ms. Seybert was encouraged to consume 5-7 servings of fruits and vegetables per day. She was also encouraged to engage in moderate to vigorous exercise for 30 minutes per day most days of the week. She was instructed to limit her alcohol consumption and continue to abstain from tobacco use.     5. Support services/counseling:   Ms. Machado was encouraged to take advantage of our many other support services programs, support groups, and/or counseling in coping with her new life as a cancer survivor after completing anti-cancer treatment.  She was offered support today through active listening and expressive supportive counseling.  She was given information regarding our available services and encouraged to contact me with any questions or for help enrolling in any of our support group/programs.    Dispo:   -Return to cancer center as needed  -Mammogram due 07/27/2023 -f/up with PCP and ob/gyn for mammograms and routine health  -She is welcome to return back to the Survivorship Clinic at any time; no additional follow-up needed at this time.  -Consider referral back to survivorship as a long-term survivor for continued surveillance   Orders Placed This Encounter  Procedures   MM DIAG BREAST TOMO BILATERAL    Standing Status:   Future    Standing Expiration Date:   02/27/2024    Order Specific Question:   Reason for Exam  (SYMPTOM  OR DIAGNOSIS REQUIRED)    Answer:   h/o left breast cancer s/p lump/RT    Order Specific Question:   Is the patient pregnant?    Answer:   No    Order Specific Question:   Preferred imaging location?    Answer:   [facility]     A total of (30) minutes of face-to-face time was spent with this patient with greater than 50% of that time in counseling and care-coordination.   Santiago Glad, NP Survivorship Program [facility] [phone]   Note: PRIMARY CARE PROVIDER Maurice Small, MD (Inactive) [phone]

## 2023-02-28 LAB — SURGICAL PATHOLOGY

## 2023-03-18 ENCOUNTER — Ambulatory Visit: Payer: BC Managed Care – PPO

## 2023-05-27 ENCOUNTER — Telehealth: Payer: Self-pay | Admitting: Hematology

## 2023-05-29 ENCOUNTER — Inpatient Hospital Stay: Payer: BC Managed Care – PPO

## 2023-05-29 ENCOUNTER — Inpatient Hospital Stay: Payer: BC Managed Care – PPO | Admitting: Hematology

## 2023-06-24 ENCOUNTER — Encounter: Payer: Self-pay | Admitting: Nurse Practitioner

## 2023-09-14 ENCOUNTER — Other Ambulatory Visit: Payer: Self-pay | Admitting: Hematology

## 2023-09-16 ENCOUNTER — Encounter: Payer: Self-pay | Admitting: Nurse Practitioner

## 2024-01-21 ENCOUNTER — Other Ambulatory Visit: Payer: Self-pay | Admitting: Obstetrics and Gynecology

## 2024-01-21 ENCOUNTER — Other Ambulatory Visit (HOSPITAL_COMMUNITY)
Admission: RE | Admit: 2024-01-21 | Discharge: 2024-01-21 | Disposition: A | Discharge status: Home / Self Care | Source: Ambulatory Visit | Attending: Obstetrics and Gynecology | Admitting: Obstetrics and Gynecology

## 2024-01-21 DIAGNOSIS — Z01419 Encounter for gynecological examination (general) (routine) without abnormal findings: Secondary | ICD-10-CM | POA: Insufficient documentation

## 2024-01-26 LAB — CYTOLOGY - PAP
Comment: NEGATIVE
Diagnosis: NEGATIVE
High risk HPV: NEGATIVE

## 2024-01-30 ENCOUNTER — Encounter: Payer: Self-pay | Admitting: Nurse Practitioner
